# Patient Record
Sex: Male | Born: 1950 | Race: White | Hispanic: No | Marital: Married | Smoking: Never smoker
Health system: Southern US, Community
[De-identification: ages and names within clinical notes are randomized; demographics above are authoritative.]

## PROBLEM LIST (undated history)

## (undated) DIAGNOSIS — I728 Aneurysm of other specified arteries: Secondary | ICD-10-CM

## (undated) DIAGNOSIS — K219 Gastro-esophageal reflux disease without esophagitis: Secondary | ICD-10-CM

## (undated) HISTORY — DX: Aneurysm of other specified arteries: I72.8

---

## 2019-03-16 ENCOUNTER — Other Ambulatory Visit: Payer: Self-pay

## 2019-03-16 DIAGNOSIS — Z20822 Contact with and (suspected) exposure to covid-19: Secondary | ICD-10-CM

## 2019-03-18 LAB — NOVEL CORONAVIRUS, NAA: SARS-CoV-2, NAA: NOT DETECTED

## 2019-06-12 ENCOUNTER — Ambulatory Visit: Payer: Self-pay

## 2019-06-18 ENCOUNTER — Ambulatory Visit: Payer: Medicare HMO | Attending: Internal Medicine

## 2019-06-18 DIAGNOSIS — Z23 Encounter for immunization: Secondary | ICD-10-CM | POA: Insufficient documentation

## 2019-06-18 NOTE — Progress Notes (Signed)
   Covid-19 Vaccination Clinic  Name:  Robert Mullen    MRN: [id number] DOB: 02-10-1951  06/18/2019  [name] was observed post Covid-19 immunization for 15 minutes without incidence. He was provided with Vaccine Information Sheet and instruction to access the V-Safe system.   [name] was instructed to call 911 with any severe reactions post vaccine: Marland Kitchen Difficulty breathing  . Swelling of your face and throat  . A fast heartbeat  . A bad rash all over your body  . Dizziness and weakness    Immunizations Administered    Name Date Dose VIS Date Route   Pfizer COVID-19 Vaccine 06/18/2019 12:11 PM 0.3 mL 04/24/2019 Intramuscular   Manufacturer: [employer], [employer]   Lot: [id number]   NDC: [address]-5

## 2019-06-23 ENCOUNTER — Ambulatory Visit: Payer: Self-pay

## 2019-07-13 ENCOUNTER — Ambulatory Visit: Payer: Medicare HMO | Attending: Internal Medicine

## 2019-07-13 DIAGNOSIS — Z23 Encounter for immunization: Secondary | ICD-10-CM | POA: Insufficient documentation

## 2019-07-13 NOTE — Progress Notes (Signed)
   Covid-19 Vaccination Clinic  Name:  Robert Mullen    MRN: [id number] DOB: 05-01-1951  07/13/2019  [name] was observed post Covid-19 immunization for 15 minutes without incidence. He was provided with Vaccine Information Sheet and instruction to access the V-Safe system.   [name] was instructed to call 911 with any severe reactions post vaccine: Marland Kitchen Difficulty breathing  . Swelling of your face and throat  . A fast heartbeat  . A bad rash all over your body  . Dizziness and weakness    Immunizations Administered    Name Date Dose VIS Date Route   Pfizer COVID-19 Vaccine 07/13/2019  3:39 PM 0.3 mL 04/24/2019 Intramuscular   Manufacturer: [employer], [employer]   Lot: [id number]   NDC: [address]-3

## 2020-02-07 ENCOUNTER — Inpatient Hospital Stay (HOSPITAL_COMMUNITY)
Admission: EM | Admit: 2020-02-07 | Discharge: 2020-02-10 | DRG: 270 | Disposition: A | Discharge status: Home / Self Care | Payer: Medicare HMO | Attending: Internal Medicine | Admitting: Internal Medicine

## 2020-02-07 ENCOUNTER — Emergency Department (HOSPITAL_COMMUNITY): Payer: Medicare HMO

## 2020-02-07 ENCOUNTER — Other Ambulatory Visit: Payer: Self-pay

## 2020-02-07 ENCOUNTER — Encounter (HOSPITAL_COMMUNITY): Payer: Self-pay | Admitting: Emergency Medicine

## 2020-02-07 DIAGNOSIS — K219 Gastro-esophageal reflux disease without esophagitis: Secondary | ICD-10-CM | POA: Diagnosis present

## 2020-02-07 DIAGNOSIS — Z23 Encounter for immunization: Secondary | ICD-10-CM | POA: Diagnosis not present

## 2020-02-07 DIAGNOSIS — D62 Acute posthemorrhagic anemia: Secondary | ICD-10-CM | POA: Diagnosis not present

## 2020-02-07 DIAGNOSIS — N179 Acute kidney failure, unspecified: Secondary | ICD-10-CM | POA: Diagnosis present

## 2020-02-07 DIAGNOSIS — K661 Hemoperitoneum: Secondary | ICD-10-CM | POA: Diagnosis present

## 2020-02-07 DIAGNOSIS — I728 Aneurysm of other specified arteries: Secondary | ICD-10-CM | POA: Diagnosis present

## 2020-02-07 DIAGNOSIS — Z880 Allergy status to penicillin: Secondary | ICD-10-CM | POA: Diagnosis not present

## 2020-02-07 DIAGNOSIS — Z20822 Contact with and (suspected) exposure to covid-19: Secondary | ICD-10-CM | POA: Diagnosis present

## 2020-02-07 DIAGNOSIS — Z8711 Personal history of peptic ulcer disease: Secondary | ICD-10-CM | POA: Diagnosis not present

## 2020-02-07 HISTORY — DX: Gastro-esophageal reflux disease without esophagitis: K21.9

## 2020-02-07 LAB — URINALYSIS, ROUTINE W REFLEX MICROSCOPIC
Bilirubin Urine: NEGATIVE
Glucose, UA: NEGATIVE mg/dL
Ketones, ur: 5 mg/dL — AB
Leukocytes,Ua: NEGATIVE
Nitrite: NEGATIVE
Protein, ur: 30 mg/dL — AB
Specific Gravity, Urine: 1.02 (ref 1.005–1.030)
pH: 5 (ref 5.0–8.0)

## 2020-02-07 LAB — COMPREHENSIVE METABOLIC PANEL
ALT: 31 U/L (ref 0–44)
AST: 28 U/L (ref 15–41)
Albumin: 4.7 g/dL (ref 3.5–5.0)
Alkaline Phosphatase: 50 U/L (ref 38–126)
Anion gap: 14 (ref 5–15)
BUN: 33 mg/dL — ABNORMAL HIGH (ref 8–23)
CO2: 25 mmol/L (ref 22–32)
Calcium: 9.9 mg/dL (ref 8.9–10.3)
Chloride: 98 mmol/L (ref 98–111)
Creatinine, Ser: 3.1 mg/dL — ABNORMAL HIGH (ref 0.61–1.24)
GFR calc Af Amer: 23 mL/min — ABNORMAL LOW (ref >60)
GFR calc non Af Amer: 20 mL/min — ABNORMAL LOW (ref >60)
Glucose, Bld: 217 mg/dL — ABNORMAL HIGH (ref 70–99)
Potassium: 4.6 mmol/L (ref 3.5–5.1)
Sodium: 137 mmol/L (ref 135–145)
Total Bilirubin: 0.8 mg/dL (ref 0.3–1.2)
Total Protein: 7.7 g/dL (ref 6.5–8.1)

## 2020-02-07 LAB — CBC
HCT: 38.5 % — ABNORMAL LOW (ref 39.0–52.0)
HCT: 32.3 % — ABNORMAL LOW (ref 39.0–52.0)
Hemoglobin: 13 g/dL (ref 13.0–17.0)
Hemoglobin: 11 g/dL — ABNORMAL LOW (ref 13.0–17.0)
MCH: 34.1 pg — ABNORMAL HIGH (ref 26.0–34.0)
MCH: 34.4 pg — ABNORMAL HIGH (ref 26.0–34.0)
MCHC: 33.8 g/dL (ref 30.0–36.0)
MCHC: 34.1 g/dL (ref 30.0–36.0)
MCV: 101 fL — ABNORMAL HIGH (ref 80.0–100.0)
MCV: 100.9 fL — ABNORMAL HIGH (ref 80.0–100.0)
Platelets: 231 10*3/uL (ref 150–400)
Platelets: 193 10*3/uL (ref 150–400)
RBC: 3.81 MIL/uL — ABNORMAL LOW (ref 4.22–5.81)
RBC: 3.2 MIL/uL — ABNORMAL LOW (ref 4.22–5.81)
RDW: 12.1 % (ref 11.5–15.5)
RDW: 12.3 % (ref 11.5–15.5)
WBC: 17.7 10*3/uL — ABNORMAL HIGH (ref 4.0–10.5)
WBC: 14.9 10*3/uL — ABNORMAL HIGH (ref 4.0–10.5)
nRBC: 0 % (ref 0.0–0.2)
nRBC: 0 % (ref 0.0–0.2)

## 2020-02-07 LAB — CK: Total CK: 54 U/L (ref 49–397)

## 2020-02-07 LAB — PROTIME-INR
INR: 1 (ref 0.8–1.2)
Prothrombin Time: 12.3 seconds (ref 11.4–15.2)

## 2020-02-07 LAB — RESPIRATORY PANEL BY RT PCR (FLU A&B, COVID)
Influenza A by PCR: NEGATIVE
Influenza B by PCR: NEGATIVE
SARS Coronavirus 2 by RT PCR: NEGATIVE

## 2020-02-07 LAB — HEMOGLOBIN AND HEMATOCRIT, BLOOD
HCT: 32.3 % — ABNORMAL LOW (ref 39.0–52.0)
Hemoglobin: 10.5 g/dL — ABNORMAL LOW (ref 13.0–17.0)

## 2020-02-07 LAB — MRSA PCR SCREENING: MRSA by PCR: NEGATIVE

## 2020-02-07 LAB — PREPARE RBC (CROSSMATCH)

## 2020-02-07 LAB — LIPASE, BLOOD: Lipase: 22 U/L (ref 11–51)

## 2020-02-07 MED ORDER — HYDROMORPHONE HCL 1 MG/ML IJ SOLN
0.5000 mg | INTRAMUSCULAR | Status: DC | PRN
  Administered 2020-02-08 – 2020-02-09 (×3): 0.5 mg via INTRAVENOUS
  Administered 2020-02-09: 1 mg via INTRAVENOUS
  Administered 2020-02-10 (×3): 0.5 mg via INTRAVENOUS
  Filled 2020-02-07 (×7): qty 1

## 2020-02-07 MED ORDER — SODIUM CHLORIDE 0.9% IV SOLUTION: Freq: Once | INTRAVENOUS | Status: DC

## 2020-02-07 MED ORDER — ACETAMINOPHEN 650 MG RE SUPP: 650.0000 mg | Freq: Four times a day (QID) | RECTAL | Status: DC | PRN

## 2020-02-07 MED ORDER — SORBITOL 70 % SOLN
30.0000 mL | Freq: Every day | Status: DC | PRN
  Filled 2020-02-07: qty 30

## 2020-02-07 MED ORDER — SODIUM CHLORIDE 0.9 % IV SOLN: Freq: Once | INTRAVENOUS | Status: AC

## 2020-02-07 MED ORDER — ORAL CARE MOUTH RINSE
15.0000 mL | Freq: Two times a day (BID) | OROMUCOSAL | Status: DC
  Administered 2020-02-08 – 2020-02-10 (×2): 15 mL via OROMUCOSAL

## 2020-02-07 MED ORDER — DIPHENHYDRAMINE HCL 25 MG PO CAPS
25.0000 mg | ORAL_CAPSULE | Freq: Once | ORAL | Status: AC
  Administered 2020-02-07: 25 mg via ORAL
  Filled 2020-02-07: qty 1

## 2020-02-07 MED ORDER — CHLORHEXIDINE GLUCONATE CLOTH 2 % EX PADS
6.0000 | MEDICATED_PAD | Freq: Every day | CUTANEOUS | Status: DC
  Administered 2020-02-07 – 2020-02-10 (×4): 6 via TOPICAL

## 2020-02-07 MED ORDER — ALUM & MAG HYDROXIDE-SIMETH 200-200-20 MG/5ML PO SUSP
30.0000 mL | Freq: Once | ORAL | Status: AC
  Administered 2020-02-07: 30 mL via ORAL
  Filled 2020-02-07: qty 30

## 2020-02-07 MED ORDER — SODIUM CHLORIDE 0.9% FLUSH
3.0000 mL | Freq: Two times a day (BID) | INTRAVENOUS | Status: DC
  Administered 2020-02-07 – 2020-02-10 (×6): 3 mL via INTRAVENOUS

## 2020-02-07 MED ORDER — SENNOSIDES-DOCUSATE SODIUM 8.6-50 MG PO TABS: 1.0000 | ORAL_TABLET | Freq: Every evening | ORAL | Status: DC | PRN

## 2020-02-07 MED ORDER — ACETAMINOPHEN 325 MG PO TABS
650.0000 mg | ORAL_TABLET | Freq: Once | ORAL | Status: DC
  Filled 2020-02-07: qty 2

## 2020-02-07 MED ORDER — ALUM & MAG HYDROXIDE-SIMETH 200-200-20 MG/5ML PO SUSP
30.0000 mL | ORAL | Status: DC | PRN
  Administered 2020-02-07: 30 mL via ORAL
  Filled 2020-02-07: qty 30

## 2020-02-07 MED ORDER — LACTATED RINGERS IV SOLN: INTRAVENOUS | Status: DC

## 2020-02-07 MED ORDER — OXYCODONE HCL 5 MG PO TABS
5.0000 mg | ORAL_TABLET | ORAL | Status: DC | PRN
  Administered 2020-02-08 – 2020-02-10 (×2): 5 mg via ORAL
  Filled 2020-02-07 (×2): qty 1

## 2020-02-07 MED ORDER — ONDANSETRON HCL 4 MG/2ML IJ SOLN
4.0000 mg | Freq: Four times a day (QID) | INTRAMUSCULAR | Status: DC | PRN
  Administered 2020-02-08: 4 mg via INTRAVENOUS
  Filled 2020-02-07: qty 2

## 2020-02-07 MED ORDER — IOHEXOL 9 MG/ML PO SOLN
ORAL | Status: AC
  Filled 2020-02-07: qty 500

## 2020-02-07 MED ORDER — SODIUM CHLORIDE 0.9 % IV BOLUS
1000.0000 mL | Freq: Once | INTRAVENOUS | Status: AC
  Administered 2020-02-07: 1000 mL via INTRAVENOUS

## 2020-02-07 MED ORDER — ACETAMINOPHEN 325 MG PO TABS
650.0000 mg | ORAL_TABLET | Freq: Four times a day (QID) | ORAL | Status: DC | PRN
  Administered 2020-02-07 – 2020-02-10 (×4): 650 mg via ORAL
  Filled 2020-02-07 (×3): qty 2

## 2020-02-07 MED ORDER — IOHEXOL 9 MG/ML PO SOLN: 1000.0000 mL | Freq: Once | ORAL | Status: DC | PRN

## 2020-02-07 MED ORDER — FENTANYL CITRATE (PF) 100 MCG/2ML IJ SOLN
50.0000 ug | Freq: Once | INTRAMUSCULAR | Status: AC
  Administered 2020-02-07: 50 ug via INTRAVENOUS
  Filled 2020-02-07: qty 2

## 2020-02-07 MED ORDER — PRAVASTATIN SODIUM 20 MG PO TABS
40.0000 mg | ORAL_TABLET | Freq: Every day | ORAL | Status: DC
  Administered 2020-02-07 – 2020-02-10 (×4): 40 mg via ORAL
  Filled 2020-02-07 (×4): qty 2

## 2020-02-07 MED ORDER — ONDANSETRON HCL 4 MG PO TABS: 4.0000 mg | ORAL_TABLET | Freq: Four times a day (QID) | ORAL | Status: DC | PRN

## 2020-02-07 NOTE — Consult Note (Signed)
Chief Complaint:   Acute abd pain     Patient Status: [facility] - ED  History of Present Illness: Robert Mullen is a 69 y.o. male with acute abd pain earlier today. Came to ED with CT showing 2cm calcified splenic and hemoperitoneum.  Hgb 13 down to 11.  Hemodynamically stable. abd pain improving while in ED.  No other complaints.  No h/o of renal disease but Cr today 3.1  HD stable and resting in bed.  NAD.   Past Medical History:  Diagnosis Date  . GERD (gastroesophageal reflux disease)     History reviewed. No pertinent surgical history.  Allergies: Amoxicillin-pot clavulanate  Medications: Prior to Admission medications   Medication Sig Start Date End Date Taking? Authorizing Provider  pravastatin (PRAVACHOL) 40 MG tablet Take 1 tablet by mouth daily. 01/19/20  Yes [provider]     History reviewed. No pertinent family history.  Social History   Socioeconomic History  . Marital status: Married    Spouse name: Not on file  . Number of children: Not on file  . Years of education: Not on file  . Highest education level: Not on file  Occupational History  . Not on file  Tobacco Use  . Smoking status: Not on file  Substance and Sexual Activity  . Alcohol use: Not Currently  . Drug use: Not Currently  . Sexual activity: Not on file  Other Topics Concern  . Not on file  Social History Narrative  . Not on file   Social Determinants of Health   Financial Resource Strain:   . Difficulty of Paying Living Expenses: Not on file  Food Insecurity:   . Worried About Programme researcher, broadcasting/film/video in the Last Year: Not on file  . Ran Out of Food in the Last Year: Not on file  Transportation Needs:   . Lack of Transportation (Medical): Not on file  . Lack of Transportation (Non-Medical): Not on file  Physical Activity:   . Days of Exercise per Week: Not on file  . Minutes of Exercise per Session: Not on file  Stress:   . Feeling of Stress : Not on file  Social  Connections:   . Frequency of Communication with Friends and Family: Not on file  . Frequency of Social Gatherings with Friends and Family: Not on file  . Attends Religious Services: Not on file  . Active Member of Clubs or Organizations: Not on file  . Attends Banker Meetings: Not on file  . Marital Status: Not on file      Review of Systems: A 12 point ROS discussed and pertinent positives are indicated in the HPI above.  All other systems are negative.  Review of Systems  Vital Signs: BP 139/89   Pulse 95   Temp 97.7 F (36.5 C) (Oral)   Resp 16   SpO2 97%   Physical Exam Constitutional:      General: He is not in acute distress.    Appearance: He is not toxic-appearing.  Cardiovascular:     Rate and Rhythm: Normal rate and regular rhythm.     Heart sounds: Normal heart sounds. No murmur heard.   Pulmonary:     Effort: Pulmonary effort is normal.     Breath sounds: Normal breath sounds.  Abdominal:     General: Bowel sounds are normal. There is distension.     Tenderness: There is abdominal tenderness in the left upper quadrant.  Hernia: No hernia is present.  Neurological:     General: No focal deficit present.     Mental Status: He is alert.  Psychiatric:        Mood and Affect: Mood normal. Mood is not anxious.     Imaging: CT ABDOMEN PELVIS WO CONTRAST  Result Date: 02/07/2020 CLINICAL DATA:  GI bleed. Abdominal pain and cramping for 24 hours. Abnormal previous CT scan. EXAM: CT ABDOMEN AND PELVIS WITHOUT CONTRAST TECHNIQUE: Multidetector CT imaging of the abdomen and pelvis was performed following the standard protocol without IV contrast. COMPARISON:  Earlier CT scan, same date. FINDINGS: Lower chest: Streaky bibasilar atelectasis noted. No pleural effusions or pulmonary infiltrates. Aortic and coronary artery calcifications are noted. Hepatobiliary: No hepatic lesions are identified without contrast. Stable high attenuation perihepatic fluid.  The gallbladder is normal. No common bile duct dilatation. Pancreas: No mass, inflammation or ductal dilatation. Spleen: Normal size.  Stable high attenuation perihepatic fluid. Adrenals/Urinary Tract: The adrenal glands are normal and stable. Small renal calculi but no obstructing ureteral calculi or bladder calculi. No worrisome renal or bladder lesions. Small bladder diverticulum noted. Stomach/Bowel: The stomach, duodenum, small bowel and colon are unremarkable. No acute inflammatory changes, mass lesions or obstructive findings. No leaking oral contrast is identified. Significant sigmoid colon diverticulosis but no findings for acute diverticulitis. Vascular/Lymphatic: The aorta is normal in caliber. Mild tortuosity and scattered atherosclerotic calcifications. No periaortic fluid collections. No mesenteric or retroperitoneal mass or adenopathy. Reproductive: The prostate gland and seminal vesicles are unremarkable. Other: Persistent but stable high attenuation fluid throughout the abdomen pelvis worrisome for hemoperitoneum. 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 Could not exclude the possibility that this is leaking. Recommend vascular surgery consultation. Musculoskeletal: No significant bony findings. IMPRESSION: 1. Persistent but stable high attenuation fluid throughout the abdomen pelvis worrisome for hemoperitoneum. 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 Could not exclude the possibility that this is leaking. Recommend vascular surgery consultation. 2. Sigmoid colon diverticulosis without findings for acute diverticulitis. 3. Small renal calculi but no obstructing ureteral calculi or bladder calculi. Electronically Signed   By: Rudie Meyer M.D.   On: 02/07/2020 16:53   CT Renal Stone Study  Result Date: 02/07/2020 CLINICAL DATA:  Flank pain.  Abdominal pain. EXAM: CT ABDOMEN AND  PELVIS WITHOUT CONTRAST TECHNIQUE: Multidetector CT imaging of the abdomen and pelvis was performed following the standard protocol without IV contrast. COMPARISON:  None. FINDINGS: Lower chest: Hazy airspace opacities in bilateral lower lobes. Additional areas of dependent atelectasis. Hepatobiliary: No focal liver abnormality is seen. No gallstones, gallbladder wall thickening, or biliary dilatation. Pancreas: Unremarkable. No pancreatic ductal dilatation or surrounding inflammatory changes. Spleen: Normal in size without focal abnormality. Adrenals/Urinary Tract: Adrenal glands are unremarkable. Kidneys are normal, without renal calculi, focal lesion, or hydronephrosis. Small right peritrigonal bladder diverticulum. Stomach/Bowel: Stomach is within normal limits. The appendix is not seen. No evidence of bowel wall thickening, distention, or inflammatory changes. Diffuse left colonic diverticulosis without evidence of acute diverticulitis. Vascular/Lymphatic: Aortic atherosclerosis. No enlarged abdominal or pelvic lymph nodes. Reproductive: Mild enlargement of the prostate gland with coarse calcifications. Other: Moderate amount of high density fluid is seen within the abdomen and pelvis. There is a rim calcified 2.1 cm structure adjacent to the tail of the pancreas with surrounding fat stranding, which may represent calcified aneurysm or pseudoaneurysm. Musculoskeletal: No acute or  significant osseous findings. IMPRESSION: 1. Moderate amount of high density fluid within the abdomen and pelvis, concerning for hemoperitoneum. Alternatively this may represent a ruptured viscus, however the lack of free gas argues against that. A third consideration would be a radio occult acute pancreatitis. CT angiography, conventional angiography and/or surgical consult is recommended. 2. 2.1 cm rim calcified structure adjacent to the tail of the pancreas with surrounding fat stranding, which may represent calcified vascular  aneurysm or pseudoaneurysm. 3. Diffuse left colonic diverticulosis without evidence of acute diverticulitis. 4. Small right peritrigonal bladder diverticulum. 5. Mild enlargement of the prostate gland with coarse calcifications. Please correlate to serum PSA values. 6. Hazy airspace opacities in the dependent portions of the bilateral lower lobes may represent atelectasis or early airspace consolidation. 7. Aortic atherosclerosis. These results were called by telephone at the time of interpretation on 02/07/2020 at 2:07 pm to provider [facility] , who verbally acknowledged these results. Aortic Atherosclerosis (ICD10-I70.0). Electronically Signed   By: Ted Mcalpine M.D.   On: 02/07/2020 14:13    Labs:  CBC: Recent Labs    02/07/20 1152 02/07/20 1732  WBC 17.7* 14.9*  HGB 13.0 11.0*  HCT 38.5* 32.3*  PLT 231 193    COAGS: No results for input(s): INR, APTT in the last 8760 hours.  BMP: Recent Labs    02/07/20 1152  NA 137  K 4.6  CL 98  CO2 25  GLUCOSE 217*  BUN 33*  CALCIUM 9.9  CREATININE 3.10*  GFRNONAA 20*  GFRAA 23*    LIVER FUNCTION TESTS: Recent Labs    02/07/20 1152  BILITOT 0.8  AST 28  ALT 31  ALKPHOS 50  PROT 7.7  ALBUMIN 4.7    TUMOR MARKERS: No results for input(s): AFPTM, CEA, CA199, CHROMGRNA in the last 8760 hours.  Assessment and Plan:  2cm splenic aneurysm with hemoperitoneum.  HD stable and NAD.  Complicating factor is a Cr of 3.1   Plan for IV hydration, serial H/H, and repeat Cr in the morning.  intermediate care observation.  Hopefully this is related to volume status as he has never been told by his [facility] primary doc that he had renal insufficiency.    Consider angio/embo based on am labs.  If he were to show signs of more bleeding, the procedure may need to be done more urgently regardless of his Cr.  He understands this and all questions addressed.  D/w Dr Gerrit Friends and [place] by phone today.  Thank you for this interesting  consult.  I greatly enjoyed meeting Messiyah Waterson and look forward to participating in their care.  A copy of this report was sent to the requesting provider on this date.  Electronically Signed: Berdine Dance, MD 02/07/2020, 7:38 PM   I spent a total of 40 Minutes    in face to face in clinical consultation, greater than 50% of which was counseling/coordinating care for this patient.

## 2020-02-07 NOTE — ED Notes (Signed)
ED TO INPATIENT HANDOFF REPORT  Name/Age/Gender Robert Mullen 69 y.o. male  Code Status    Code Status Orders  (From admission, onward)         Start     Ordered   02/07/20 1904  Full code  Continuous        02/07/20 1913        Code Status History    This patient has a current code status but no historical code status.   Advance Care Planning Activity      Home/SNF/Other Home  Chief Complaint Hemoperitoneum [K66.1]  Level of Care/Admitting Diagnosis ED Disposition    ED Disposition Condition Comment   Admit  Hospital Area: [facility] [[id number]]  Level of Care: Stepdown [14]  Admit to SDU based on following criteria: Hemodynamic compromise or significant risk of instability:  Patient requiring short term acute titration and management of vasoactive drips, and invasive monitoring (i.e., CVP and Arterial line).  May admit patient to [facility] or [facility] if equivalent level of care is available:: No  Covid Evaluation: Confirmed COVID Negative  Diagnosis: Hemoperitoneum [431540]  Admitting Physician: Milinda Antis  Attending Physician: Gerri Lins, AVA [phone]  Estimated length of stay: 3 - 4 days  Certification:: I certify this patient will need inpatient services for at least 2 midnights       Medical History Past Medical History:  Diagnosis Date  . GERD (gastroesophageal reflux disease)     Allergies Allergies  Allergen Reactions  . Amoxicillin-Pot Clavulanate     Other reaction(s): Abdominal Pain    IV Location/Drains/Wounds Patient Lines/Drains/Airways Status    Active Line/Drains/Airways    Name Placement date Placement time Site Days   Peripheral IV 02/07/20 Right Antecubital 02/07/20  1151  Antecubital  less than 1          Labs/Imaging Results for orders placed or performed during the hospital encounter of 02/07/20 (from the past 48 hour(s))  Lipase, blood     Status: None   Collection Time: 02/07/20 11:52 AM   Result Value Ref Range   Lipase 22 11 - 51 U/L    Comment: Performed at [facility], [address] [place], [place] [address]  Comprehensive metabolic panel     Status: Abnormal   Collection Time: 02/07/20 11:52 AM  Result Value Ref Range   Sodium 137 135 - 145 mmol/L   Potassium 4.6 3.5 - 5.1 mmol/L   Chloride 98 98 - 111 mmol/L   CO2 25 22 - 32 mmol/L   Glucose, Bld 217 (H) 70 - 99 mg/dL    Comment: Glucose reference range applies only to samples taken after fasting for at least 8 hours.   BUN 33 (H) 8 - 23 mg/dL   Creatinine, Ser 9.32 (H) 0.61 - 1.24 mg/dL   Calcium 9.9 8.9 - 67.1 mg/dL   Total Protein 7.7 6.5 - 8.1 g/dL   Albumin 4.7 3.5 - 5.0 g/dL   AST 28 15 - 41 U/L   ALT 31 0 - 44 U/L   Alkaline Phosphatase 50 38 - 126 U/L   Total Bilirubin 0.8 0.3 - 1.2 mg/dL   GFR calc non Af Amer 20 (L) >60 mL/min   GFR calc Af Amer 23 (L) >60 mL/min   Anion gap 14 5 - 15    Comment: Performed at [facility], [address], [place], [place] [address]  CBC     Status: Abnormal  Collection Time: 02/07/20 11:52 AM  Result Value Ref Range   WBC 17.7 (H) 4.0 - 10.5 K/uL   RBC 3.81 (L) 4.22 - 5.81 MIL/uL   Hemoglobin 13.0 13.0 - 17.0 g/dL   HCT 16.1 (L) 39 - 52 %   MCV 101.0 (H) 80.0 - 100.0 fL   MCH 34.1 (H) 26.0 - 34.0 pg   MCHC 33.8 30.0 - 36.0 g/dL   RDW 09.6 04.5 - 40.9 %   Platelets 231 150 - 400 K/uL   nRBC 0.0 0.0 - 0.2 %    Comment: Performed at [facility], [address], [place], [place] [address]  CK     Status: None   Collection Time: 02/07/20  1:58 PM  Result Value Ref Range   Total CK 54 49.0 - 397.0 U/L    Comment: Performed at [facility], [address], [place], [place] [address]  Respiratory Panel by RT PCR (Flu A&B, Covid) - Nasopharyngeal Swab     Status: None   Collection Time: 02/07/20  2:09 PM   Specimen: Nasopharyngeal Swab  Result Value Ref Range   SARS Coronavirus 2  by RT PCR NEGATIVE NEGATIVE    Comment: (NOTE) SARS-CoV-2 target nucleic acids are NOT DETECTED.  The SARS-CoV-2 RNA is generally detectable in upper respiratoy specimens during the acute phase of infection. The lowest concentration of SARS-CoV-2 viral copies this assay can detect is 131 copies/mL. A negative result does not preclude SARS-Cov-2 infection and should not be used as the sole basis for treatment or other patient management decisions. A negative result may occur with  improper specimen collection/handling, submission of specimen other than nasopharyngeal swab, presence of viral mutation(s) within the areas targeted by this assay, and inadequate number of viral copies (<131 copies/mL). A negative result must be combined with clinical observations, patient history, and epidemiological information. The expected result is Negative.  Fact Sheet for Patients:  https://www.moore.com/  Fact Sheet for Healthcare Providers:  https://www.young.biz/  This test is no t yet approved or cleared by the [place] FDA and  has been authorized for detection and/or diagnosis of SARS-CoV-2 by FDA under an Emergency Use Authorization (EUA). This EUA will remain  in effect (meaning this test can be used) for the duration of the COVID-19 declaration under Section 564(b)(1) of the Act, 21 U.S.C. section 360bbb-3(b)(1), unless the authorization is terminated or revoked sooner.     Influenza A by PCR NEGATIVE NEGATIVE   Influenza B by PCR NEGATIVE NEGATIVE    Comment: (NOTE) The Xpert Xpress SARS-CoV-2/FLU/RSV assay is intended as an aid in  the diagnosis of influenza from Nasopharyngeal swab specimens and  should not be used as a sole basis for treatment. Nasal washings and  aspirates are unacceptable for Xpert Xpress SARS-CoV-2/FLU/RSV  testing.  Fact Sheet for Patients: https://www.moore.com/  Fact Sheet for Healthcare  Providers: https://www.young.biz/  This test is not yet approved or cleared by the [place] FDA and  has been authorized for detection and/or diagnosis of SARS-CoV-2 by  FDA under an Emergency Use Authorization (EUA). This EUA will remain  in effect (meaning this test can be used) for the duration of the  Covid-19 declaration under Section 564(b)(1) of the Act, 21  U.S.C. section 360bbb-3(b)(1), unless the authorization is  terminated or revoked. Performed at [facility], [address] [place], [place] [address]   CBC     Status: Abnormal   Collection Time: 02/07/20  5:32  PM  Result Value Ref Range   WBC 14.9 (H) 4.0 - 10.5 K/uL   RBC 3.20 (L) 4.22 - 5.81 MIL/uL   Hemoglobin 11.0 (L) 13.0 - 17.0 g/dL   HCT 16.132.3 (L) 39 - 52 %   MCV 100.9 (H) 80.0 - 100.0 fL   MCH 34.4 (H) 26.0 - 34.0 pg   MCHC 34.1 30.0 - 36.0 g/dL   RDW 09.612.3 04.511.5 - 40.915.5 %   Platelets 193 150 - 400 K/uL   nRBC 0.0 0.0 - 0.2 %    Comment: Performed at [facility], [address] [place], [place] [id number]   CT ABDOMEN PELVIS WO CONTRAST  Result Date: 02/07/2020 CLINICAL DATA:  GI bleed. Abdominal pain and cramping for 24 hours. Abnormal previous CT scan. EXAM: CT ABDOMEN AND PELVIS WITHOUT CONTRAST TECHNIQUE: Multidetector CT imaging of the abdomen and pelvis was performed following the standard protocol without IV contrast. COMPARISON:  Earlier CT scan, same date. FINDINGS: Lower chest: Streaky bibasilar atelectasis noted. No pleural effusions or pulmonary infiltrates. Aortic and coronary artery calcifications are noted. Hepatobiliary: No hepatic lesions are identified without contrast. Stable high attenuation perihepatic fluid. The gallbladder is normal. No common bile duct dilatation. Pancreas: No mass, inflammation or ductal dilatation. Spleen: Normal size.  Stable high attenuation perihepatic fluid. Adrenals/Urinary Tract: The adrenal glands are  normal and stable. Small renal calculi but no obstructing ureteral calculi or bladder calculi. No worrisome renal or bladder lesions. Small bladder diverticulum noted. Stomach/Bowel: The stomach, duodenum, small bowel and colon are unremarkable. No acute inflammatory changes, mass lesions or obstructive findings. No leaking oral contrast is identified. Significant sigmoid colon diverticulosis but no findings for acute diverticulitis. Vascular/Lymphatic: The aorta is normal in caliber. Mild tortuosity and scattered atherosclerotic calcifications. No periaortic fluid collections. No mesenteric or retroperitoneal mass or adenopathy. Reproductive: The prostate gland and seminal vesicles are unremarkable. Other: Persistent but stable high attenuation fluid throughout the abdomen pelvis worrisome for hemoperitoneum. 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 Could not exclude the possibility that this is leaking. Recommend vascular surgery consultation. Musculoskeletal: No significant bony findings. IMPRESSION: 1. Persistent but stable high attenuation fluid throughout the abdomen pelvis worrisome for hemoperitoneum. 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 Could not exclude the possibility that this is leaking. Recommend vascular surgery consultation. 2. Sigmoid colon diverticulosis without findings for acute diverticulitis. 3. Small renal calculi but no obstructing ureteral calculi or bladder calculi. Electronically Signed   By: Rudie MeyerP.  Gallerani M.D.   On: 02/07/2020 16:53   CT Renal Stone Study  Result Date: 02/07/2020 CLINICAL DATA:  Flank pain.  Abdominal pain. EXAM: CT ABDOMEN AND PELVIS WITHOUT CONTRAST TECHNIQUE: Multidetector CT imaging of the abdomen and pelvis was performed following the standard protocol without IV contrast. COMPARISON:  None. FINDINGS: Lower chest: Hazy airspace opacities in  bilateral lower lobes. Additional areas of dependent atelectasis. Hepatobiliary: No focal liver abnormality is seen. No gallstones, gallbladder wall thickening, or biliary dilatation. Pancreas: Unremarkable. No pancreatic ductal dilatation or surrounding inflammatory changes. Spleen: Normal in size without focal abnormality. Adrenals/Urinary Tract: Adrenal glands are unremarkable. Kidneys are normal, without renal calculi, focal lesion, or hydronephrosis. Small right peritrigonal bladder diverticulum. Stomach/Bowel: Stomach is within normal limits. The appendix is not seen. No evidence of bowel wall thickening, distention, or inflammatory changes. Diffuse left colonic diverticulosis without evidence of acute diverticulitis. Vascular/Lymphatic:  Aortic atherosclerosis. No enlarged abdominal or pelvic lymph nodes. Reproductive: Mild enlargement of the prostate gland with coarse calcifications. Other: Moderate amount of high density fluid is seen within the abdomen and pelvis. There is a rim calcified 2.1 cm structure adjacent to the tail of the pancreas with surrounding fat stranding, which may represent calcified aneurysm or pseudoaneurysm. Musculoskeletal: No acute or significant osseous findings. IMPRESSION: 1. Moderate amount of high density fluid within the abdomen and pelvis, concerning for hemoperitoneum. Alternatively this may represent a ruptured viscus, however the lack of free gas argues against that. A third consideration would be a radio occult acute pancreatitis. CT angiography, conventional angiography and/or surgical consult is recommended. 2. 2.1 cm rim calcified structure adjacent to the tail of the pancreas with surrounding fat stranding, which may represent calcified vascular aneurysm or pseudoaneurysm. 3. Diffuse left colonic diverticulosis without evidence of acute diverticulitis. 4. Small right peritrigonal bladder diverticulum. 5. Mild enlargement of the prostate gland with coarse  calcifications. Please correlate to serum PSA values. 6. Hazy airspace opacities in the dependent portions of the bilateral lower lobes may represent atelectasis or early airspace consolidation. 7. Aortic atherosclerosis. These results were called by telephone at the time of interpretation on 02/07/2020 at 2:07 pm to provider [facility] , who verbally acknowledged these results. Aortic Atherosclerosis (ICD10-I70.0). Electronically Signed   By: Ted Mcalpine M.D.   On: 02/07/2020 14:13    Pending Labs Unresulted Labs (From admission, onward)          Start     Ordered   02/08/20 0500  Comprehensive metabolic panel  Tomorrow morning,   R        02/07/20 1913   02/08/20 0500  CBC  Tomorrow morning,   R        02/07/20 1913   02/07/20 1941  Prepare RBC (crossmatch)  (Adult Blood Administration - Red Blood Cells)  Once,   R       Question Answer Comment  # of Units 4 units   Transfusion Indications Actively Bleeding / GI Bleed   Number of Units to Keep Ahead NO units ahead   If emergent release call blood bank Not emergent release      02/07/20 1941   02/07/20 1934  Hemoglobin and hematocrit, blood  Now then every 8 hours,   R (with STAT occurrences)      02/07/20 1934   02/07/20 1914  Protime-INR  Once,   STAT        02/07/20 1913   02/07/20 1905  Urinalysis, Complete w Microscopic  Once,   STAT        02/07/20 1913   02/07/20 1903  HIV Antibody (routine testing w rflx)  (HIV Antibody (Routine testing w reflex) panel)  Once,   STAT        02/07/20 1913   02/07/20 1825  Type and screen  ONCE - STAT,   STAT        02/07/20 1824   02/07/20 1153  Urine culture  ONCE - STAT,   STAT        02/07/20 1152   02/07/20 1057  Urinalysis, Routine w reflex microscopic Urine, Clean Catch  ONCE - STAT,   STAT        02/07/20 1057          Vitals/Pain Today's Vitals   02/07/20 1800 02/07/20 1830 02/07/20 1900 02/07/20 1930  BP: (!) 139/92 (!) 140/96 139/89 124/83  Pulse: 84 100 95 95  Resp: 16 18 16 18   Temp:      TempSrc:      SpO2: 97% 97% 97% 98%  PainSc:        Isolation Precautions No active isolations  Medications Medications  iohexol (OMNIPAQUE) 9 MG/ML oral solution 1,000 mL (has no administration in time range)  iohexol (OMNIPAQUE) 9 MG/ML oral solution (has no administration in time range)  sodium chloride flush (NS) 0.9 % injection 3 mL (has no administration in time range)  acetaminophen (TYLENOL) tablet 650 mg (650 mg Oral Given 02/07/20 1944)    Or  acetaminophen (TYLENOL) suppository 650 mg ( Rectal See Alternative 02/07/20 1944)  oxyCODONE (Oxy IR/ROXICODONE) immediate release tablet 5 mg (has no administration in time range)  HYDROmorphone (DILAUDID) injection 0.5-1 mg (has no administration in time range)  senna-docusate (Senokot-S) tablet 1 tablet (has no administration in time range)  sorbitol 70 % solution 30 mL (has no administration in time range)  ondansetron (ZOFRAN) tablet 4 mg (has no administration in time range)    Or  ondansetron (ZOFRAN) injection 4 mg (has no administration in time range)  pravastatin (PRAVACHOL) tablet 40 mg (has no administration in time range)  lactated ringers infusion (has no administration in time range)  0.9 %  sodium chloride infusion (Manually program via Guardrails IV Fluids) (has no administration in time range)  acetaminophen (TYLENOL) tablet 650 mg (0 mg Oral Hold 02/07/20 1948)  alum & mag hydroxide-simeth (MAALOX/MYLANTA) 200-200-20 MG/5ML suspension 30 mL (30 mLs Oral Given 02/07/20 1944)  0.9 %  sodium chloride infusion (Manually program via Guardrails IV Fluids) (has no administration in time range)  fentaNYL (SUBLIMAZE) injection 50 mcg (50 mcg Intravenous Given 02/07/20 1200)  sodium chloride 0.9 % bolus 1,000 mL (0 mLs Intravenous Stopped 02/07/20 1403)  alum & mag hydroxide-simeth (MAALOX/MYLANTA) 200-200-20 MG/5ML suspension 30 mL (30 mLs Oral Given 02/07/20 1403)  0.9 %  sodium chloride infusion  ( Intravenous New Bag/Given (Non-Interop) 02/07/20 1913)  diphenhydrAMINE (BENADRYL) capsule 25 mg (25 mg Oral Given 02/07/20 1944)    Mobility walks

## 2020-02-07 NOTE — ED Triage Notes (Signed)
Per pt, states abdominal pain and cramping for 24 hours-vomited once, normal BM this am

## 2020-02-07 NOTE — H&P (Signed)
Robert Mullen is an 69 y.o. male.   Chief Complaint: Abdominal pain, nausea, dizziness. HPI: The patient is a 69 yr old man. He states that yesterday, while he was in a hot shower he had sudden onset of abdominal pain. He then had dizziness such that he left the shower and went back to bed. He states that he had a very difficult time with pain and lightheadedness yesterday. His symptoms worsened today.  In the ED the patient was found to have Blood pressure of 108/81, normal temperature, tachycardia, and normal oxygen saturation.   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   Labwork demonstrated WBC of 17.7, Hemoglobin of 13.0, platelets of 231, Creatnine was 3.1. It appears that baseline creatinine is 1.0. Anion gap is 14. CBC was repeated 6 hour later after the patient had received IV fluids. WBC was now 14.9, Hemoglobin was now 11.0. Platelets were 193. INR is 1.0.  General surgery and interventional radiology were consulted by EDP. The patient is unable to undergo IR procedure to embolize the bleeding vessel due to his renal insufficiency. Recommendation of general surgery is to type and cross the patient for 4 units PRBC's, monitor H&H, IV fluid, and evaluate for reasons fo renal status.  The patient will be admitted to a step down bed. She will be given IV fluids, renal ultrasound is pending. Consider nephrology consult in the am if no improvement in creatinine.  The patient denies fevers, but he has had chills. Positive for nausea and vomiting and abdominal pain. No diarrhea or constipation. No neurological changes other than the feeling of dizziness.  [facility] have been consulted to admit the patient for further evaluation and treatment.   Past Medical History:  Diagnosis Date  . GERD (gastroesophageal reflux disease)     History  reviewed. No pertinent surgical history.  History reviewed. No pertinent family history. Social History:  reports previous alcohol use. He reports previous drug use. No history on file for tobacco use. (Not in a hospital admission)   Allergies:  Allergies  Allergen Reactions  . Amoxicillin-Pot Clavulanate     Other reaction(s): Abdominal Pain    Pertinent items noted in HPI and remainder of comprehensive ROS otherwise negative.   General appearance: alert, cooperative, fatigued and mild distress Head: Normocephalic, without obvious abnormality, atraumatic Eyes: conjunctivae/corneas clear. PERRL, EOM's intact. Fundi benign. Throat: lips, mucosa, and tongue normal; teeth and gums normal Neck: no adenopathy, no carotid bruit, no JVD, supple, symmetrical, trachea midline and thyroid not enlarged, symmetric, no tenderness/mass/nodules Resp: No increased work of breathing. No wheezes, rales, or rhonchi. No tactile fremitus. Chest wall: no tenderness Cardio: regular rate and rhythm, S1, S2 normal, no murmur, click, rub or gallop GI: Abdomen is distended, diffusely tender, especially in the left upper quadrant. There are hypoactive bowel sounds. No masses, organomegaly, or hernias are appreciated. Extremities: extremities normal, atraumatic, no cyanosis or edema Pulses: 2+ and symmetric Skin: Skin color, texture, turgor normal. No rashes or lesions Lymph nodes: Cervical, supraclavicular, and axillary nodes normal. Neurologic: Alert and oriented X 3, normal strength and tone. Normal symmetric reflexes. Normal coordination and gait  Results for orders placed or performed during the hospital encounter of 02/07/20 (from the past 48 hour(s))  Lipase, blood     Status: None   Collection Time: 02/07/20 11:52 AM  Result Value Ref Range  Lipase 22 11 - 51 U/L    Comment: Performed at [facility], [address] [place], [place] [address]  Comprehensive metabolic panel      Status: Abnormal   Collection Time: 02/07/20 11:52 AM  Result Value Ref Range   Sodium 137 135 - 145 mmol/L   Potassium 4.6 3.5 - 5.1 mmol/L   Chloride 98 98 - 111 mmol/L   CO2 25 22 - 32 mmol/L   Glucose, Bld 217 (H) 70 - 99 mg/dL    Comment: Glucose reference range applies only to samples taken after fasting for at least 8 hours.   BUN 33 (H) 8 - 23 mg/dL   Creatinine, Ser 6.96 (H) 0.61 - 1.24 mg/dL   Calcium 9.9 8.9 - 78.9 mg/dL   Total Protein 7.7 6.5 - 8.1 g/dL   Albumin 4.7 3.5 - 5.0 g/dL   AST 28 15 - 41 U/L   ALT 31 0 - 44 U/L   Alkaline Phosphatase 50 38 - 126 U/L   Total Bilirubin 0.8 0.3 - 1.2 mg/dL   GFR calc non Af Amer 20 (L) >60 mL/min   GFR calc Af Amer 23 (L) >60 mL/min   Anion gap 14 5 - 15    Comment: Performed at [facility], [address] [place], [place] [address]  CBC     Status: Abnormal   Collection Time: 02/07/20 11:52 AM  Result Value Ref Range   WBC 17.7 (H) 4.0 - 10.5 K/uL   RBC 3.81 (L) 4.22 - 5.81 MIL/uL   Hemoglobin 13.0 13.0 - 17.0 g/dL   HCT 75.1 (L) 39 - 52 %   MCV 101.0 (H) 80.0 - 100.0 fL   MCH 34.1 (H) 26.0 - 34.0 pg   MCHC 33.8 30.0 - 36.0 g/dL   RDW 02.5 85.2 - 77.8 %   Platelets 231 150 - 400 K/uL   nRBC 0.0 0.0 - 0.2 %    Comment: Performed at [facility], [address] [place], [place] [address]  CK     Status: None   Collection Time: 02/07/20  1:58 PM  Result Value Ref Range   Total CK 54 49.0 - 397.0 U/L    Comment: Performed at [facility], [address] [place], [place] [address]  Respiratory Panel by RT PCR (Flu A&B, Covid) - Nasopharyngeal Swab     Status: None   Collection Time: 02/07/20  2:09 PM   Specimen: Nasopharyngeal Swab  Result Value Ref Range   SARS Coronavirus 2 by RT PCR NEGATIVE NEGATIVE    Comment: (NOTE) SARS-CoV-2 target nucleic acids are NOT DETECTED.  The SARS-CoV-2 RNA is generally detectable in upper respiratoy specimens during the  acute phase of infection. The lowest concentration of SARS-CoV-2 viral copies this assay can detect is 131 copies/mL. A negative result does not preclude SARS-Cov-2 infection and should not be used as the sole basis for treatment or other patient management decisions. A negative result may occur with  improper specimen collection/handling, submission of specimen other than nasopharyngeal swab, presence of viral mutation(s) within the areas targeted by this assay, and inadequate number of viral copies (<131 copies/mL). A negative result must be combined with clinical observations, patient history, and epidemiological information. The expected result is Negative.  Fact Sheet for Patients:  https://www.moore.com/  Fact Sheet for Healthcare Providers:  https://www.young.biz/  This test is no t yet approved or cleared by the [place] and  has been authorized for detection and/or diagnosis of SARS-CoV-2 by FDA under an Emergency Use Authorization (EUA). This EUA will remain  in effect (meaning this test can be used) for the duration of the COVID-19 declaration under Section 564(b)(1) of the Act, 21 U.S.C. section 360bbb-3(b)(1), unless the authorization is terminated or revoked sooner.     Influenza A by PCR NEGATIVE NEGATIVE   Influenza B by PCR NEGATIVE NEGATIVE    Comment: (NOTE) The Xpert Xpress SARS-CoV-2/FLU/RSV assay is intended as an aid in  the diagnosis of influenza from Nasopharyngeal swab specimens and  should not be used as a sole basis for treatment. Nasal washings and  aspirates are unacceptable for Xpert Xpress SARS-CoV-2/FLU/RSV  testing.  Fact Sheet for Patients: https://www.moore.com/  Fact Sheet for Healthcare Providers: https://www.young.biz/  This test is not yet approved or cleared by the [place] FDA and  has been authorized for detection and/or diagnosis of  SARS-CoV-2 by  FDA under an Emergency Use Authorization (EUA). This EUA will remain  in effect (meaning this test can be used) for the duration of the  Covid-19 declaration under Section 564(b)(1) of the Act, 21  U.S.C. section 360bbb-3(b)(1), unless the authorization is  terminated or revoked. Performed at [facility], [address], [place], [place] [address]   CBC     Status: Abnormal   Collection Time: 02/07/20  5:32 PM  Result Value Ref Range   WBC 14.9 (H) 4.0 - 10.5 K/uL   RBC 3.20 (L) 4.22 - 5.81 MIL/uL   Hemoglobin 11.0 (L) 13.0 - 17.0 g/dL   HCT 57.8 (L) 39 - 52 %   MCV 100.9 (H) 80.0 - 100.0 fL   MCH 34.4 (H) 26.0 - 34.0 pg   MCHC 34.1 30.0 - 36.0 g/dL   RDW 46.9 62.9 - 52.8 %   Platelets 193 150 - 400 K/uL   nRBC 0.0 0.0 - 0.2 %    Comment: Performed at [facility], [address] [place], [place] [address]   [email or web address] @  Blood pressure 139/89, pulse 95, temperature 97.7 F (36.5 C), temperature source Oral, resp. rate 16, SpO2 97 %.   Assessment/Plan Problem  Nontraumatic Hemoperitoneum  Splenic Artery Aneurysm (Hcc)  Acute Kidney Injury (Nontraumatic) (Hcc)  Hemoperitoneum   Splenic artery aneurysm: 2cm splenic artery aneurysm visible on CT abdomen and pelvis with possible leakage. IR may be able to embolize the source of bleeding, but currently renal status will not tolerate IV contrast. Surgical intervention may also be used to address the source of bleeding, but holds considerably more risk and difficulty due to blood in the peritoneal cavity.  Non-traumatic hemoperitoneum: Will monitor hemoglobin and there are 4 units PRBC's Typed and crossed and held. Likely source of bleeding is the splenic artery aneurysm. PT/INR normal.  AKI: Creatinine upon presentation is 3.1. Baseline creatinine appears to be 1.0. DDx: Increased creatinine due to volume depletion/hypotension due to intraperitoneal hemorrhage, obstructive  uropathy, other primary renal cause. The patient will receive IV fluids overnight. Renal ultrasound has been ordered. Consider nephrology consult should the creatinine not be substantially improved in the morning.  GERD: Continue PPI.  I have seen and examined this patient myself. I have spent 82 minutes in her evaluation and care. More than 50% of this time was spent on coordination of care with Dr. of General Surgery.  DVT Prophylaxis; SCD's CODE STATUS: Full Code Family Communication: None available. Disposition: The patient has been admitted to a  step down bed as an inpatient. Status is: Inpatient  Remains inpatient appropriate because:Inpatient level of care appropriate due to severity of illness   Dispo: The patient is from: Home              Anticipated d/c is to: tbd              Anticipated d/c date is: > 3 days              Patient currently is not medically stable to d/c.  Severity of Illness: The appropriate patient status for this patient is INPATIENT. Inpatient status is judged to be reasonable and necessary in order to provide the required intensity of service to ensure the patient's safety. The patient's presenting symptoms, physical exam findings, and initial radiographic and laboratory data in the context of their chronic comorbidities is felt to place them at high risk for further clinical deterioration. Furthermore, it is not anticipated that the patient will be medically stable for discharge from the hospital within 2 midnights of admission. The following factors support the patient status of inpatient.   " The patient's presenting symptoms include Pain, abdominal distention. " The worrisome physical exam findings include abdominal distention and tenderness to palpation. " The initial radiographic and laboratory data are worrisome because of Hemoperitoneum due to splenic artery aneurysmal bleed.. " The chronic co-morbidities include None.   * I certify that at  the point of admission it is my clinical judgment that the patient will require inpatient hospital care spanning beyond 2 midnights from the point of admission due to high intensity of service, high risk for further deterioration and high frequency of surveillance required.*  Robert Mullen 02/07/2020, 7:42 PM

## 2020-02-07 NOTE — ED Notes (Signed)
General surgery at bedside. 

## 2020-02-07 NOTE — ED Provider Notes (Signed)
Care assumed from [facility] PA-C at shift change, please see her note for full details, but in brief Robert Mullen is a 69 y.o. male who presents with 24 hours of abdominal pain and one episode of emesis, abdomen is mildly distended.  Patient found to have AKI with creatinine of 3.1, was previously 1.  CT renal stone study shows a moderate amount of high density fluid in the abdomen pelvis concerning for hemoperitoneum versus ruptured viscus versus pancreatitis.  No trauma, lipase is normal.  Dr. Rowe Clack with surgery has been consulted and is coming to see the patient, but requests CT with p.o. contrast.  Plan: follow-up on CT, patient will need admission, waiting for surgical evaluation to determine if patient will go to surgery service or medicine team  BP (!) 140/96   Pulse 100   Temp 97.7 F (36.5 C) (Oral)   Resp 18   SpO2 97%    ED Course/Procedures   Clinical Course as of Feb 06 1606  Sun Feb 07, 2020  1244 Chart review shows creatinine of 1 in October 2020.   [HK]  1449 Spoke to Dr. Gerrit Friends, on-call surgeon.  Asked that we obtain a CT scan with p.o. contrast to evaluate if this is a perforated viscus.  Reexamined patient, he states that he may have been told in the past that he had an ulcer but has never underwent an endoscopy.   [HK]    Clinical Course User Index [HK] Dietrich Pates, PA-C   CT ABDOMEN PELVIS WO CONTRAST  Result Date: 02/07/2020 CLINICAL DATA:  GI bleed. Abdominal pain and cramping for 24 hours. Abnormal previous CT scan. EXAM: CT ABDOMEN AND PELVIS WITHOUT CONTRAST TECHNIQUE: Multidetector CT imaging of the abdomen and pelvis was performed following the standard protocol without IV contrast. COMPARISON:  Earlier CT scan, same date. FINDINGS: Lower chest: Streaky bibasilar atelectasis noted. No pleural effusions or pulmonary infiltrates. Aortic and coronary artery calcifications are noted. Hepatobiliary: No hepatic lesions are identified without contrast. Stable high  attenuation perihepatic fluid. The gallbladder is normal. No common bile duct dilatation. Pancreas: No mass, inflammation or ductal dilatation. Spleen: Normal size.  Stable high attenuation perihepatic fluid. Adrenals/Urinary Tract: The adrenal glands are normal and stable. Small renal calculi but no obstructing ureteral calculi or bladder calculi. No worrisome renal or bladder lesions. Small bladder diverticulum noted. Stomach/Bowel: The stomach, duodenum, small bowel and colon are unremarkable. No acute inflammatory changes, mass lesions or obstructive findings. No leaking oral contrast is identified. Significant sigmoid colon diverticulosis but no findings for acute diverticulitis. Vascular/Lymphatic: The aorta is normal in caliber. Mild tortuosity and scattered atherosclerotic calcifications. No periaortic fluid collections. No mesenteric or retroperitoneal mass or adenopathy. Reproductive: The prostate gland and seminal vesicles are unremarkable. Other: Persistent but stable high attenuation fluid throughout the abdomen pelvis worrisome for hemoperitoneum. 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 Could not exclude the possibility that this is leaking. Recommend vascular surgery consultation. Musculoskeletal: No significant bony findings. IMPRESSION: 1. Persistent but stable high attenuation fluid throughout the abdomen pelvis worrisome for hemoperitoneum. 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 Could not exclude the possibility that this is leaking. Recommend vascular surgery consultation. 2. Sigmoid colon diverticulosis without findings for acute diverticulitis. 3. Small renal calculi but no obstructing ureteral calculi or bladder calculi. Electronically Signed   By: Rudie Meyer M.D.   On: 02/07/2020  16:53   CT Renal Stone Study  Result Date: 02/07/2020 CLINICAL DATA:  Flank pain.   Abdominal pain. EXAM: CT ABDOMEN AND PELVIS WITHOUT CONTRAST TECHNIQUE: Multidetector CT imaging of the abdomen and pelvis was performed following the standard protocol without IV contrast. COMPARISON:  None. FINDINGS: Lower chest: Hazy airspace opacities in bilateral lower lobes. Additional areas of dependent atelectasis. Hepatobiliary: No focal liver abnormality is seen. No gallstones, gallbladder wall thickening, or biliary dilatation. Pancreas: Unremarkable. No pancreatic ductal dilatation or surrounding inflammatory changes. Spleen: Normal in size without focal abnormality. Adrenals/Urinary Tract: Adrenal glands are unremarkable. Kidneys are normal, without renal calculi, focal lesion, or hydronephrosis. Small right peritrigonal bladder diverticulum. Stomach/Bowel: Stomach is within normal limits. The appendix is not seen. No evidence of bowel wall thickening, distention, or inflammatory changes. Diffuse left colonic diverticulosis without evidence of acute diverticulitis. Vascular/Lymphatic: Aortic atherosclerosis. No enlarged abdominal or pelvic lymph nodes. Reproductive: Mild enlargement of the prostate gland with coarse calcifications. Other: Moderate amount of high density fluid is seen within the abdomen and pelvis. There is a rim calcified 2.1 cm structure adjacent to the tail of the pancreas with surrounding fat stranding, which may represent calcified aneurysm or pseudoaneurysm. Musculoskeletal: No acute or significant osseous findings. IMPRESSION: 1. Moderate amount of high density fluid within the abdomen and pelvis, concerning for hemoperitoneum. Alternatively this may represent a ruptured viscus, however the lack of free gas argues against that. A third consideration would be a radio occult acute pancreatitis. CT angiography, conventional angiography and/or surgical consult is recommended. 2. 2.1 cm rim calcified structure adjacent to the tail of the pancreas with surrounding fat stranding, which  may represent calcified vascular aneurysm or pseudoaneurysm. 3. Diffuse left colonic diverticulosis without evidence of acute diverticulitis. 4. Small right peritrigonal bladder diverticulum. 5. Mild enlargement of the prostate gland with coarse calcifications. Please correlate to serum PSA values. 6. Hazy airspace opacities in the dependent portions of the bilateral lower lobes may represent atelectasis or early airspace consolidation. 7. Aortic atherosclerosis. These results were called by telephone at the time of interpretation on 02/07/2020 at 2:07 pm to provider [facility] , who verbally acknowledged these results. Aortic Atherosclerosis (ICD10-I70.0). Electronically Signed   By: Ted Mcalpine M.D.   On: 02/07/2020 14:13   .Critical Care Performed by: Dartha Lodge, PA-C Authorized by: Dartha Lodge, PA-C   Critical care provider statement:    Critical care time (minutes):  45   Critical care was necessary to treat or prevent imminent or life-threatening deterioration of the following conditions:  Circulatory failure (Leaking splenic artery aneurysm)   Critical care was time spent personally by me on the following activities:  Discussions with consultants, evaluation of patient's response to treatment, examination of patient, ordering and performing treatments and interventions, ordering and review of laboratory studies, ordering and review of radiographic studies, pulse oximetry, re-evaluation of patient's condition, obtaining history from patient or surrogate and review of old charts Comments:     Patient found to have hemoperitoneum due to leaking splenic artery aneurysm    MDM   CT with oral contrast shows persistent but stable high attenuation fluid throughout the abdomen and pelvis worrisome for hemoperitoneum, the epicenter appears to be in the left upper quadrant surrounding a 2 cm splenic artery aneurysm, there is a possibility that this is leaking into the abdomen.  Will  discuss CT findings again with Dr. Gerrit Friends to determine if intervention with vascular surgery or interventional radiology will be necessary.  Dr.  Gerkin recommends consulting interventional radiology as patient will likely need embolization, he will still plan to see the patient shortly in the ED, and recommends medicine admission.  Will also get repeat CBC to check delta hemoglobin.  Case discussed with Dr. Miles Costain with interventional radiology who will review patient scans shortly and give me a call back.  Medicine called for admission.  Repeat hemoglobin has dropped 2 points and is now 11.  Will check type and screen, currently patient remains hemodynamically stable.  Case discussed with Dr. Gerri Lins with [facility] who will see and admit the patient.  Final diagnoses:  AKI (acute kidney injury) (HCC)  Splenic artery aneurysm [facility])  Hemoperitoneum       Legrand Rams 02/07/20 1909    Tegeler, Canary Brim, MD 02/08/20 [phone]

## 2020-02-07 NOTE — ED Provider Notes (Signed)
[facility]-EMERGENCY DEPT Provider Note   CSN: [id number] Arrival date & time: 02/07/20  1030     History Chief Complaint  Patient presents with  . Abdominal Pain    Robert Mullen is a 69 y.o. male with a past medical history of GERD presenting to the ED with a chief complaint of abdominal pain.  Started having pain approximately 24 hours ago.  Reports 1 episode of nonbloody, nonbilious emesis.  He has been taking his daily antacid without much improvement in his symptoms.  He denies any changes to bowel movements, had a normal bowel movement earlier today.  Denies any dysuria or hematuria.  Reports the pain is causing him to have some difficulty breathing due to the muscle soreness.  No sick contacts with similar symptoms.  No suspicious food intake.  Denies any back pain, fever, chest pain, injuries or falls, history of kidney stones.  No prior abdominal surgeries. He switched from famotidine to omeprazole 4 days ago for his reflux.  HPI     Past Medical History:  Diagnosis Date  . GERD (gastroesophageal reflux disease)     There are no problems to display for this patient.   History reviewed. No pertinent surgical history.     No family history on file.  Social History   Tobacco Use  . Smoking status: Not on file  Substance Use Topics  . Alcohol use: Not Currently  . Drug use: Not Currently    Home Medications Prior to Admission medications   Medication Sig Start Date End Date Taking? Authorizing Provider  pravastatin (PRAVACHOL) 40 MG tablet Take 1 tablet by mouth daily. 01/19/20  Yes [provider]    Allergies    Amoxicillin-pot clavulanate  Review of Systems   Review of Systems  Constitutional: Negative for appetite change, chills and fever.  HENT: Negative for ear pain, rhinorrhea, sneezing and sore throat.   Eyes: Negative for photophobia and visual disturbance.  Respiratory: Negative for cough, chest tightness, shortness  of breath and wheezing.   Cardiovascular: Negative for chest pain and palpitations.  Gastrointestinal: Positive for abdominal pain, nausea and vomiting. Negative for blood in stool, constipation and diarrhea.  Genitourinary: Negative for dysuria, hematuria and urgency.  Musculoskeletal: Negative for myalgias.  Skin: Negative for rash.  Neurological: Negative for dizziness, weakness and light-headedness.    Physical Exam Updated Vital Signs BP (!) 134/93   Pulse 95   Temp 97.7 F (36.5 C) (Oral)   Resp 18   SpO2 97%   Physical Exam Vitals and nursing note reviewed.  Constitutional:      General: He is not in acute distress.    Appearance: He is well-developed.  HENT:     Head: Normocephalic and atraumatic.     Nose: Nose normal.  Eyes:     General: No scleral icterus.       Left eye: No discharge.     Conjunctiva/sclera: Conjunctivae normal.  Cardiovascular:     Rate and Rhythm: Normal rate and regular rhythm.     Heart sounds: Normal heart sounds. No murmur heard.  No friction rub. No gallop.   Pulmonary:     Effort: Pulmonary effort is normal. No respiratory distress.     Breath sounds: Normal breath sounds.  Abdominal:     General: Bowel sounds are normal. There is no distension.     Palpations: Abdomen is soft.     Tenderness: There is abdominal tenderness in the right upper quadrant and  right lower quadrant. There is no guarding.  Musculoskeletal:        General: Normal range of motion.     Cervical back: Normal range of motion and neck supple.  Skin:    General: Skin is warm and dry.     Findings: No rash.  Neurological:     Mental Status: He is alert.     Motor: No abnormal muscle tone.     Coordination: Coordination normal.     ED Results / Procedures / Treatments   Labs (all labs ordered are listed, but only abnormal results are displayed) Labs Reviewed  COMPREHENSIVE METABOLIC PANEL - Abnormal; Notable for the following components:      Result Value    Glucose, Bld 217 (*)    BUN 33 (*)    Creatinine, Ser 3.10 (*)    GFR calc non Af Amer 20 (*)    GFR calc Af Amer 23 (*)    All other components within normal limits  CBC - Abnormal; Notable for the following components:   WBC 17.7 (*)    RBC 3.81 (*)    HCT 38.5 (*)    MCV 101.0 (*)    MCH 34.1 (*)    All other components within normal limits  URINE CULTURE  RESPIRATORY PANEL BY RT PCR (FLU A&B, COVID)  LIPASE, BLOOD  CK  URINALYSIS, ROUTINE W REFLEX MICROSCOPIC    EKG EKG Interpretation  Date/Time:  Sunday February 07 2020 13:56:46 EDT Ventricular Rate:  90 PR Interval:    QRS Duration: 101 QT Interval:  350 QTC Calculation: 429 R Axis:   62 Text Interpretation: Sinus rhythm No prior ECG for comparison. No STEMI Confirmed by Theda Belfast ([id number]) on 02/07/2020 2:26:14 PM   Radiology CT Renal Stone Study  Result Date: 02/07/2020 CLINICAL DATA:  Flank pain.  Abdominal pain. EXAM: CT ABDOMEN AND PELVIS WITHOUT CONTRAST TECHNIQUE: Multidetector CT imaging of the abdomen and pelvis was performed following the standard protocol without IV contrast. COMPARISON:  None. FINDINGS: Lower chest: Hazy airspace opacities in bilateral lower lobes. Additional areas of dependent atelectasis. Hepatobiliary: No focal liver abnormality is seen. No gallstones, gallbladder wall thickening, or biliary dilatation. Pancreas: Unremarkable. No pancreatic ductal dilatation or surrounding inflammatory changes. Spleen: Normal in size without focal abnormality. Adrenals/Urinary Tract: Adrenal glands are unremarkable. Kidneys are normal, without renal calculi, focal lesion, or hydronephrosis. Small right peritrigonal bladder diverticulum. Stomach/Bowel: Stomach is within normal limits. The appendix is not seen. No evidence of bowel wall thickening, distention, or inflammatory changes. Diffuse left colonic diverticulosis without evidence of acute diverticulitis. Vascular/Lymphatic: Aortic atherosclerosis.  No enlarged abdominal or pelvic lymph nodes. Reproductive: Mild enlargement of the prostate gland with coarse calcifications. Other: Moderate amount of high density fluid is seen within the abdomen and pelvis. There is a rim calcified 2.1 cm structure adjacent to the tail of the pancreas with surrounding fat stranding, which may represent calcified aneurysm or pseudoaneurysm. Musculoskeletal: No acute or significant osseous findings. IMPRESSION: 1. Moderate amount of high density fluid within the abdomen and pelvis, concerning for hemoperitoneum. Alternatively this may represent a ruptured viscus, however the lack of free gas argues against that. A third consideration would be a radio occult acute pancreatitis. CT angiography, conventional angiography and/or surgical consult is recommended. 2. 2.1 cm rim calcified structure adjacent to the tail of the pancreas with surrounding fat stranding, which may represent calcified vascular aneurysm or pseudoaneurysm. 3. Diffuse left colonic diverticulosis without evidence of acute diverticulitis. 4.  Small right peritrigonal bladder diverticulum. 5. Mild enlargement of the prostate gland with coarse calcifications. Please correlate to serum PSA values. 6. Hazy airspace opacities in the dependent portions of the bilateral lower lobes may represent atelectasis or early airspace consolidation. 7. Aortic atherosclerosis. These results were called by telephone at the time of interpretation on 02/07/2020 at 2:07 pm to [facility] , who verbally acknowledged these results. Aortic Atherosclerosis (ICD10-I70.0). Electronically Signed   By: Ted Mcalpineobrinka  Dimitrova M.D.   On: 02/07/2020 14:13    Procedures .Critical Care Performed by: Dietrich PatesKhatri, Khaleah Duer, PA-C Authorized by: Dietrich PatesKhatri, Stephen Turnbaugh, PA-C   Critical care provider statement:    Critical care time (minutes):  45   Critical care was necessary to treat or prevent imminent or life-threatening deterioration of the following  conditions:  Renal failure, metabolic crisis and shock   Critical care was time spent personally by me on the following activities:  Development of treatment plan with patient or surrogate, discussions with consultants, examination of patient, obtaining history from patient or surrogate, ordering and performing treatments and interventions, ordering and review of laboratory studies, ordering and review of radiographic studies, re-evaluation of patient's condition, review of old charts and evaluation of patient's response to treatment   I assumed direction of critical care for this patient from another provider in my specialty: no     (including critical care time)  Medications Ordered in ED Medications  iohexol (OMNIPAQUE) 9 MG/ML oral solution 1,000 mL (has no administration in time range)  iohexol (OMNIPAQUE) 9 MG/ML oral solution (has no administration in time range)  fentaNYL (SUBLIMAZE) injection 50 mcg (50 mcg Intravenous Given 02/07/20 1200)  sodium chloride 0.9 % bolus 1,000 mL (0 mLs Intravenous Stopped 02/07/20 1403)  alum & mag hydroxide-simeth (MAALOX/MYLANTA) 200-200-20 MG/5ML suspension 30 mL (30 mLs Oral Given 02/07/20 1403)    ED Course  I have reviewed the triage vital signs and the nursing notes.  Pertinent labs & imaging results that were available during my care of the patient were reviewed by me and considered in my medical decision making (see chart for details).  Clinical Course as of Feb 07 1503  Sun Feb 07, 2020  1244 Chart review shows creatinine of 1 in October 2020.   [HK]  1449 Spoke to Dr. Gerrit FriendsGerkin, on-call surgeon.  Asked that we obtain a CT scan with p.o. contrast to evaluate if this is a perforated viscus.  Reexamined patient, he states that he may have been told in the past that he had an ulcer but has never underwent an endoscopy.   [HK]    Clinical Course User Index [HK] Dietrich PatesKhatri, Joselyne Spake, PA-C   MDM Rules/Calculators/A&P                          69 year old  male with a past medical history of GERD presenting to the ED with a chief complaint of abdominal pain.  Pain began approximately 24 hours ago with 1 episode of nonbloody, nonbilious emesis.  Pain is located in the right upper and right middle quadrant and will intermittently radiate to his right lower quadrant.  No urinary symptoms.  Had a normal bowel movement today.  Reports muscle soreness in his abdomen.  Patient denies any recent strenuous activity other than what he usually does as far as playing tennis.  Work appear significant for leukocytosis of 17, creatinine of 3, his creatinine of 1 year ago was 1.  No history of kidney  disease that he is aware of.  Lipase unremarkable.  Will need to obtain UA, CK, CT renal stone study and reassess.  CK is normal.  EKG shows sinus rhythm.  CT renal stone study shows moderate amount of high density fluid within the abdomen pelvis concerning for hemoperitoneum versus ruptured viscus versus pancreatitis.  Patient denies any trauma.  Lipase is normal here.  Will consult surgery to review imaging and give recommendations.    Will obtain CT of abdomen pelvis with p.o. contrast per surgery recommendations, to evaluate if this is a ruptured viscus.  Patient updated on plan.  Told patient and visitor that he will be admitted to the hospital for his AKI at the very least.  He is agreeable to this plan. He remains HDS. Care handed off to oncoming provider pending remainder of workup and recheck.  All imaging, if done today, including plain films, CT scans, and ultrasounds, independently reviewed by me, and interpretations confirmed via formal radiology reads.  Portions of this note were generated with Scientist, clinical (histocompatibility and immunogenetics). Dictation errors may occur despite best attempts at proofreading.  Final Clinical Impression(s) / ED Diagnoses Final diagnoses:  AKI (acute kidney injury) [facility])    Rx / DC Orders ED Discharge Orders    None       Dietrich Pates,  PA-C 02/07/20 1507    Tegeler, Canary Brim, MD 02/08/20 [phone]

## 2020-02-07 NOTE — Plan of Care (Signed)
The patient is admitted from ED to ICU SD. A & O x 4. No acute respiratory distress noted. C/O abdominal pain of 4/10 but refused to take pain medication. The patient is oriented to his room, call bell and staff. Full assessment to epic completed.  No active bleeding/hematoma noted.Will continue to monitor.

## 2020-02-07 NOTE — Progress Notes (Signed)
General Surgery [facility]  Telephone conversation with Dr. Ruel Favors from interventional radiology.  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  However, he is concerned about the acute kidney injury with a creatinine of 3.1.  There is no obvious cause for the kidney injury.  He is concerned about administering intravenous contrast material at this time.  Alternatively we could proceed with laparotomy and splenectomy for management.  However, this would be a relatively extensive procedure and may be difficult due to the amount of blood in the abdomen which would distort the tissue planes and due to the location of the aneurysm adjacent to the tail of the pancreas.  Patient does appear to be hemodynamically stable.  He has been symptomatic for 30 hours.  Repeat hemoglobin shows a decrease in hemoglobin from 13 to 11 over the past 6 hours with intravenous hydration.  I would recommend admitting the patient to the stepdown unit for hemodynamic monitoring.  Patient should have sequential hemoglobin determinations.  Patient should continue to be resuscitated with intravenous fluids and any other measures recommended by the medical service once they have evaluated the patient. Patient should be typed and crossmatched for 4 units of PRBC's to be held in the blood bank in case needed. If the patient remains stable overnight, we will repeat his laboratory studies tomorrow morning and if there is adequate improvement in his renal function, proceed with embolization of the splenic artery by interventional radiology at that time.  Certainly if the patient would clinically deteriorate in any way, we would need to proceed to the operating room urgently for splenectomy.  I will discuss this plan with the medical service once they have evaluated the patient.  I did call the emergency department  and communicated this information to the treatment team at that location.  Darnell Level, MD [facility] Office: [phone]

## 2020-02-07 NOTE — Consult Note (Signed)
General Surgery [facility]  Reason for Consult: abdominal pain, hemoperitoneum  Referring Physician: Dr. Rush Landmark, [facility]  Robert Mullen is an 69 y.o. male.  HPI: Patient is a 69 year old male who presents with a 1 day history of abdominal pain and distention associated with syncope.  Patient developed acute onset of sharp mid abdominal pain while taking a shower approximately midday on February 06, 2020.  Patient felt faint and had to get out of the shower and lie down.  Patient has had persistent abdominal discomfort and abdominal distention.  He presented to the emergency department for evaluation.  CT scan of the abdomen and pelvis demonstrated high density fluid in the upper abdomen, around the liver, and extending into the lower abdomen.  Initial CT scan was done without oral contrast.  Scan was repeated with oral contrast to rule out perforated viscus.  There is no sign of perforated viscus.  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  There is high suspicion of the splenic artery aneurysm being source of hemoperitoneum.  Laboratory studies are also notable for a elevated creatinine of 3.1.  Patient has no significant past medical or surgical history.  He takes a statin.  He takes a proton pump inhibitor.  He has had a history of peptic ulcer disease.  He has had no prior abdominal surgery.  Patient is accompanied by his wife.  He works in [employer], dispensing.  Past Medical History:  Diagnosis Date  . GERD (gastroesophageal reflux disease)     History reviewed. No pertinent surgical history.  History reviewed. No pertinent family history.  Social History:  reports previous alcohol use. He reports previous drug use. No history on file for tobacco use.  Allergies:  Allergies  Allergen Reactions  . Amoxicillin-Pot Clavulanate     Other reaction(s): Abdominal Pain    Medications:  I have reviewed the patient's current medications.  Results for orders placed or performed during the hospital encounter of 02/07/20 (from the past 48 hour(s))  Lipase, blood     Status: None   Collection Time: 02/07/20 11:52 AM  Result Value Ref Range   Lipase 22 11 - 51 U/L    Comment: Performed at [facility], [address] [place], [place] [address]  Comprehensive metabolic panel     Status: Abnormal   Collection Time: 02/07/20 11:52 AM  Result Value Ref Range   Sodium 137 135 - 145 mmol/L   Potassium 4.6 3.5 - 5.1 mmol/L   Chloride 98 98 - 111 mmol/L   CO2 25 22 - 32 mmol/L   Glucose, Bld 217 (H) 70 - 99 mg/dL    Comment: Glucose reference range applies only to samples taken after fasting for at least 8 hours.   BUN 33 (H) 8 - 23 mg/dL   Creatinine, Ser 4.25 (H) 0.61 - 1.24 mg/dL   Calcium 9.9 8.9 - 95.6 mg/dL   Total Protein 7.7 6.5 - 8.1 g/dL   Albumin 4.7 3.5 - 5.0 g/dL   AST 28 15 - 41 U/L   ALT 31 0 - 44 U/L   Alkaline Phosphatase 50 38 - 126 U/L   Total Bilirubin 0.8 0.3 - 1.2 mg/dL   GFR calc non Af Amer 20 (L) >60 mL/min   GFR calc Af Amer 23 (L) >60 mL/min   Anion gap 14 5 - 15    Comment:  Performed at [facility], [address] [place], [place] [address]  CBC     Status: Abnormal   Collection Time: 02/07/20 11:52 AM  Result Value Ref Range   WBC 17.7 (H) 4.0 - 10.5 K/uL   RBC 3.81 (L) 4.22 - 5.81 MIL/uL   Hemoglobin 13.0 13.0 - 17.0 g/dL   HCT 81.138.5 (L) 39 - 52 %   MCV 101.0 (H) 80.0 - 100.0 fL   MCH 34.1 (H) 26.0 - 34.0 pg   MCHC 33.8 30.0 - 36.0 g/dL   RDW 91.412.1 78.211.5 - 95.615.5 %   Platelets 231 150 - 400 K/uL   nRBC 0.0 0.0 - 0.2 %    Comment: Performed at [facility], [address] [place], [place] [address]  [place]     Status: None   Collection Time: 02/07/20  1:58 PM  Result Value Ref Range   Total CK 54 49.0 - 397.0 U/L    Comment: Performed at [facility], [address] [place], [place] [address]  Respiratory Panel by RT PCR (Flu A&B, Covid) - Nasopharyngeal Swab     Status: None   Collection Time: 02/07/20  2:09 PM   Specimen: Nasopharyngeal Swab  Result Value Ref Range   SARS Coronavirus 2 by RT PCR NEGATIVE NEGATIVE    Comment: (NOTE) SARS-CoV-2 target nucleic acids are NOT DETECTED.  The SARS-CoV-2 RNA is generally detectable in upper respiratoy specimens during the acute phase of infection. The lowest concentration of SARS-CoV-2 viral copies this assay can detect is 131 copies/mL. A negative result does not preclude SARS-Cov-2 infection and should not be used as the sole basis for treatment or other patient management decisions. A negative result may occur with  improper specimen collection/handling, submission of specimen other than nasopharyngeal swab, presence of viral mutation(s) within the areas targeted by this assay, and inadequate number of viral copies (<131 copies/mL). A negative result must be combined with clinical observations, patient history, and epidemiological information. The expected result is Negative.  Fact Sheet for Patients:  https://www.moore.com/https://www.fda.gov/media/142436/download  Fact Sheet for Healthcare Providers:  https://www.young.biz/https://www.fda.gov/media/142435/download  This test is no t yet approved or cleared by the [place] FDA and  has been authorized for detection and/or diagnosis of SARS-CoV-2 by FDA under an Emergency Use Authorization (EUA). This EUA will remain  in effect (meaning this test can be used) for the duration of the COVID-19 declaration under Section 564(b)(1) of the Act, 21 U.S.C. section 360bbb-3(b)(1), unless the authorization is terminated or revoked sooner.     Influenza A by PCR NEGATIVE NEGATIVE   Influenza B by PCR NEGATIVE NEGATIVE    Comment: (NOTE) The Xpert Xpress SARS-CoV-2/FLU/RSV assay is intended as an aid in  the diagnosis of influenza from Nasopharyngeal swab specimens and  should not be used  as a sole basis for treatment. Nasal washings and  aspirates are unacceptable for Xpert Xpress SARS-CoV-2/FLU/RSV  testing.  Fact Sheet for Patients: https://www.moore.com/https://www.fda.gov/media/142436/download  Fact Sheet for Healthcare Providers: https://www.young.biz/https://www.fda.gov/media/142435/download  This test is not yet approved or cleared by the [place] FDA and  has been authorized for detection and/or diagnosis of SARS-CoV-2 by  FDA under an Emergency Use Authorization (EUA). This EUA will remain  in effect (meaning this test can be used) for the duration of the  Covid-19 declaration under Section 564(b)(1) of the Act, 21  U.S.C. section 360bbb-3(b)(1), unless the authorization is  terminated or revoked. Performed at [facility],  [address] [place], [place] [address]   CBC     Status: Abnormal   Collection Time: 02/07/20  5:32 PM  Result Value Ref Range   WBC 14.9 (H) 4.0 - 10.5 K/uL   RBC 3.20 (L) 4.22 - 5.81 MIL/uL   Hemoglobin 11.0 (L) 13.0 - 17.0 g/dL   HCT 60.4 (L) 39 - 52 %   MCV 100.9 (H) 80.0 - 100.0 fL   MCH 34.4 (H) 26.0 - 34.0 pg   MCHC 34.1 30.0 - 36.0 g/dL   RDW 54.0 98.1 - 19.1 %   Platelets 193 150 - 400 K/uL   nRBC 0.0 0.0 - 0.2 %    Comment: Performed at [facility], [address], [place], [place] [address]    CT ABDOMEN PELVIS WO CONTRAST  Result Date: 02/07/2020 CLINICAL DATA:  GI bleed. Abdominal pain and cramping for 24 hours. Abnormal previous CT scan. EXAM: CT ABDOMEN AND PELVIS WITHOUT CONTRAST TECHNIQUE: Multidetector CT imaging of the abdomen and pelvis was performed following the standard protocol without IV contrast. COMPARISON:  Earlier CT scan, same date. FINDINGS: Lower chest: Streaky bibasilar atelectasis noted. No pleural effusions or pulmonary infiltrates. Aortic and coronary artery calcifications are noted. Hepatobiliary: No hepatic lesions are identified without contrast. Stable high attenuation perihepatic fluid.  The gallbladder is normal. No common bile duct dilatation. Pancreas: No mass, inflammation or ductal dilatation. Spleen: Normal size.  Stable high attenuation perihepatic fluid. Adrenals/Urinary Tract: The adrenal glands are normal and stable. Small renal calculi but no obstructing ureteral calculi or bladder calculi. No worrisome renal or bladder lesions. Small bladder diverticulum noted. Stomach/Bowel: The stomach, duodenum, small bowel and colon are unremarkable. No acute inflammatory changes, mass lesions or obstructive findings. No leaking oral contrast is identified. Significant sigmoid colon diverticulosis but no findings for acute diverticulitis. Vascular/Lymphatic: The aorta is normal in caliber. Mild tortuosity and scattered atherosclerotic calcifications. No periaortic fluid collections. No mesenteric or retroperitoneal mass or adenopathy. Reproductive: The prostate gland and seminal vesicles are unremarkable. Other: Persistent but stable high attenuation fluid throughout the abdomen pelvis worrisome for hemoperitoneum. 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 Could not exclude the possibility that this is leaking. Recommend vascular surgery consultation. Musculoskeletal: No significant bony findings. IMPRESSION: 1. Persistent but stable high attenuation fluid throughout the abdomen pelvis worrisome for hemoperitoneum. 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 Could not exclude the possibility that this is leaking. Recommend vascular surgery consultation. 2. Sigmoid colon diverticulosis without findings for acute diverticulitis. 3. Small renal calculi but no obstructing ureteral calculi or bladder calculi. Electronically Signed   By: Rudie Meyer M.D.   On: 02/07/2020 16:53   CT Renal Stone Study  Result Date: 02/07/2020 CLINICAL DATA:  Flank pain.  Abdominal pain. EXAM: CT ABDOMEN AND  PELVIS WITHOUT CONTRAST TECHNIQUE: Multidetector CT imaging of the abdomen and pelvis was performed following the standard protocol without IV contrast. COMPARISON:  None. FINDINGS: Lower chest: Hazy airspace opacities in bilateral lower lobes. Additional areas of dependent atelectasis. Hepatobiliary: No focal liver abnormality is seen. No gallstones, gallbladder wall thickening, or biliary dilatation. Pancreas: Unremarkable. No pancreatic ductal dilatation or surrounding inflammatory changes. Spleen: Normal in size without focal abnormality. Adrenals/Urinary Tract: Adrenal glands are unremarkable. Kidneys are normal, without renal calculi, focal lesion, or hydronephrosis. Small right peritrigonal bladder diverticulum. Stomach/Bowel: Stomach is within normal limits. The  appendix is not seen. No evidence of bowel wall thickening, distention, or inflammatory changes. Diffuse left colonic diverticulosis without evidence of acute diverticulitis. Vascular/Lymphatic: Aortic atherosclerosis. No enlarged abdominal or pelvic lymph nodes. Reproductive: Mild enlargement of the prostate gland with coarse calcifications. Other: Moderate amount of high density fluid is seen within the abdomen and pelvis. There is a rim calcified 2.1 cm structure adjacent to the tail of the pancreas with surrounding fat stranding, which may represent calcified aneurysm or pseudoaneurysm. Musculoskeletal: No acute or significant osseous findings. IMPRESSION: 1. Moderate amount of high density fluid within the abdomen and pelvis, concerning for hemoperitoneum. Alternatively this may represent a ruptured viscus, however the lack of free gas argues against that. A third consideration would be a radio occult acute pancreatitis. CT angiography, conventional angiography and/or surgical consult is recommended. 2. 2.1 cm rim calcified structure adjacent to the tail of the pancreas with surrounding fat stranding, which may represent calcified vascular  aneurysm or pseudoaneurysm. 3. Diffuse left colonic diverticulosis without evidence of acute diverticulitis. 4. Small right peritrigonal bladder diverticulum. 5. Mild enlargement of the prostate gland with coarse calcifications. Please correlate to serum PSA values. 6. Hazy airspace opacities in the dependent portions of the bilateral lower lobes may represent atelectasis or early airspace consolidation. 7. Aortic atherosclerosis. These results were called by telephone at the time of interpretation on 02/07/2020 at 2:07 pm to provider [facility] , who verbally acknowledged these results. Aortic Atherosclerosis (ICD10-I70.0). Electronically Signed   By: Ted Mcalpine M.D.   On: 02/07/2020 14:13    Review of Systems  Constitutional: Positive for appetite change.  HENT: Negative.   Eyes: Negative.   Respiratory:       Abd pain with deep inspiration  Cardiovascular: Negative.   Gastrointestinal: Positive for abdominal distention, abdominal pain and nausea.  Endocrine: Negative.   Genitourinary: Negative.   Musculoskeletal: Negative.   Skin: Negative.   Allergic/Immunologic: Negative.   Neurological: Positive for light-headedness.  Hematological: Negative.   Psychiatric/Behavioral: Negative.     Physical Exam  Blood pressure (!) 138/99, pulse (!) 101, temperature 97.7 F (36.5 C), temperature source Oral, resp. rate 18, SpO2 98 %.  CONSTITUTIONAL: no acute distress; conversant; no obvious deformities  EYES: conjunctiva moist; no lid lag; anicteric; pupils equal bilaterally  NECK: trachea midline; no thyroid nodularity  LUNGS: respiratory effort normal & unlabored; no wheeze; no rales; no tactile fremitus  CV: rate and rhythm regular; no palpable thrills; no murmur; no edema bilat lower extremities  GI: abdomen with moderate distension; diffuse mild tenderness; soft; no mass; some guarding; no hepatosplenomegaly; no obvious hernia  MSK: normal range of motion of  extremities; no clubbing; no cyanosis  PSYCH: appropriate affect for situation; alert and oriented to person, place, & time  LYMPHATIC: no palpable cervical lymphadenopathy; no evidence lymphedema in extremities   Assessment/Plan:  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  I discussed the CT scan findings with the patient and his wife.  I explained to them that we believe the fluid in the abdomen is likely blood and that it most likely originated from the splenic artery aneurysm.  Interventional radiology has been consulted and will evaluate the patient and review the studies.  Patient will be admitted to the medical service for management including attention to his acute kidney injury.  General surgery will follow closely and be available in case urgent operative intervention would become  necessary, which is unlikely.  Discussed with Adelina Mings, the physician assistant in the emergency department.  Darnell Level, MD [facility] Office: [phone]    Darnell Level 02/07/2020, 6:10 PM

## 2020-02-08 ENCOUNTER — Inpatient Hospital Stay (HOSPITAL_COMMUNITY): Payer: Medicare HMO

## 2020-02-08 ENCOUNTER — Encounter (HOSPITAL_COMMUNITY): Payer: Self-pay | Admitting: Internal Medicine

## 2020-02-08 HISTORY — PX: IR ANGIOGRAM VISCERAL SELECTIVE: IMG657

## 2020-02-08 HISTORY — PX: IR US GUIDE VASC ACCESS RIGHT: IMG2390

## 2020-02-08 HISTORY — PX: IR TRANSCATH PLC STENT 1ST ART NOT LE CV CAR VERT CAR: IMG5443

## 2020-02-08 HISTORY — PX: IR ANGIOGRAM SELECTIVE EACH ADDITIONAL VESSEL: IMG667

## 2020-02-08 HISTORY — PX: IR EMBO ARTERIAL NOT HEMORR HEMANG INC GUIDE ROADMAPPING: IMG5448

## 2020-02-08 LAB — BASIC METABOLIC PANEL
Anion gap: 7 (ref 5–15)
BUN: 19 mg/dL (ref 8–23)
CO2: 28 mmol/L (ref 22–32)
Calcium: 8.3 mg/dL — ABNORMAL LOW (ref 8.9–10.3)
Chloride: 103 mmol/L (ref 98–111)
Creatinine, Ser: 1.09 mg/dL (ref 0.61–1.24)
GFR calc Af Amer: >60 mL/min (ref >60)
GFR calc non Af Amer: >60 mL/min (ref >60)
Glucose, Bld: 113 mg/dL — ABNORMAL HIGH (ref 70–99)
Potassium: 3.8 mmol/L (ref 3.5–5.1)
Sodium: 138 mmol/L (ref 135–145)

## 2020-02-08 LAB — COMPREHENSIVE METABOLIC PANEL
ALT: 23 U/L (ref 0–44)
AST: 20 U/L (ref 15–41)
Albumin: 3.4 g/dL — ABNORMAL LOW (ref 3.5–5.0)
Alkaline Phosphatase: 35 U/L — ABNORMAL LOW (ref 38–126)
Anion gap: 7 (ref 5–15)
BUN: 29 mg/dL — ABNORMAL HIGH (ref 8–23)
CO2: 28 mmol/L (ref 22–32)
Calcium: 8.6 mg/dL — ABNORMAL LOW (ref 8.9–10.3)
Chloride: 102 mmol/L (ref 98–111)
Creatinine, Ser: 1.52 mg/dL — ABNORMAL HIGH (ref 0.61–1.24)
GFR calc Af Amer: 54 mL/min — ABNORMAL LOW (ref >60)
GFR calc non Af Amer: 47 mL/min — ABNORMAL LOW (ref >60)
Glucose, Bld: 115 mg/dL — ABNORMAL HIGH (ref 70–99)
Potassium: 4.4 mmol/L (ref 3.5–5.1)
Sodium: 137 mmol/L (ref 135–145)
Total Bilirubin: 0.8 mg/dL (ref 0.3–1.2)
Total Protein: 5.9 g/dL — ABNORMAL LOW (ref 6.5–8.1)

## 2020-02-08 LAB — CBC
HCT: 28.2 % — ABNORMAL LOW (ref 39.0–52.0)
Hemoglobin: 9.3 g/dL — ABNORMAL LOW (ref 13.0–17.0)
MCH: 34.2 pg — ABNORMAL HIGH (ref 26.0–34.0)
MCHC: 33 g/dL (ref 30.0–36.0)
MCV: 103.7 fL — ABNORMAL HIGH (ref 80.0–100.0)
Platelets: 162 10*3/uL (ref 150–400)
RBC: 2.72 MIL/uL — ABNORMAL LOW (ref 4.22–5.81)
RDW: 12.4 % (ref 11.5–15.5)
WBC: 9.9 10*3/uL (ref 4.0–10.5)
nRBC: 0 % (ref 0.0–0.2)

## 2020-02-08 LAB — HEMOGLOBIN AND HEMATOCRIT, BLOOD
HCT: 27.6 % — ABNORMAL LOW (ref 39.0–52.0)
HCT: 28.6 % — ABNORMAL LOW (ref 39.0–52.0)
HCT: 27.1 % — ABNORMAL LOW (ref 39.0–52.0)
Hemoglobin: 9.2 g/dL — ABNORMAL LOW (ref 13.0–17.0)
Hemoglobin: 9.4 g/dL — ABNORMAL LOW (ref 13.0–17.0)
Hemoglobin: 9 g/dL — ABNORMAL LOW (ref 13.0–17.0)

## 2020-02-08 LAB — ABO/RH: ABO/RH(D): A POS

## 2020-02-08 LAB — HIV ANTIBODY (ROUTINE TESTING W REFLEX): HIV Screen 4th Generation wRfx: NONREACTIVE

## 2020-02-08 MED ORDER — MIDAZOLAM HCL 2 MG/2ML IJ SOLN
INTRAMUSCULAR | Status: AC | PRN
  Administered 2020-02-08: 1 mg via INTRAVENOUS
  Administered 2020-02-08: 0.5 mg via INTRAVENOUS
  Administered 2020-02-08 (×4): 1 mg via INTRAVENOUS
  Administered 2020-02-08: 0.5 mg via INTRAVENOUS
  Administered 2020-02-08 (×3): 1 mg via INTRAVENOUS

## 2020-02-08 MED ORDER — FENTANYL CITRATE (PF) 100 MCG/2ML IJ SOLN
INTRAMUSCULAR | Status: AC
Start: 2020-02-08 — End: 2020-02-09
  Filled 2020-02-08: qty 2

## 2020-02-08 MED ORDER — MIDAZOLAM HCL 2 MG/2ML IJ SOLN
INTRAMUSCULAR | Status: AC
  Filled 2020-02-08: qty 2

## 2020-02-08 MED ORDER — FENTANYL CITRATE (PF) 100 MCG/2ML IJ SOLN
INTRAMUSCULAR | Status: AC | PRN
  Administered 2020-02-08: 25 ug via INTRAVENOUS
  Administered 2020-02-08 (×4): 50 ug via INTRAVENOUS
  Administered 2020-02-08: 25 ug via INTRAVENOUS
  Administered 2020-02-08: 50 ug via INTRAVENOUS

## 2020-02-08 MED ORDER — IODIXANOL 320 MG/ML IV SOLN
50.0000 mL | Freq: Once | INTRAVENOUS | Status: AC | PRN
  Administered 2020-02-08: 35 mL via INTRA_ARTERIAL

## 2020-02-08 MED ORDER — LIDOCAINE HCL (PF) 1 % IJ SOLN
INTRAMUSCULAR | Status: AC | PRN
  Administered 2020-02-08: 10 mL via INTRADERMAL

## 2020-02-08 MED ORDER — IODIXANOL 320 MG/ML IV SOLN
50.0000 mL | Freq: Once | INTRAVENOUS | Status: AC | PRN
  Administered 2020-02-08: 30 mL via INTRA_ARTERIAL

## 2020-02-08 MED ORDER — MIDAZOLAM HCL 2 MG/2ML IJ SOLN
INTRAMUSCULAR | Status: AC
  Filled 2020-02-08: qty 4

## 2020-02-08 MED ORDER — LIDOCAINE HCL 1 % IJ SOLN
INTRAMUSCULAR | Status: AC
  Filled 2020-02-08: qty 20

## 2020-02-08 NOTE — Sedation Documentation (Signed)
Patient is resting comfortably, with eyes closed, in NAD. °

## 2020-02-08 NOTE — Progress Notes (Signed)
Patient requested for medications for indigestion and allergy. Stated he takes them at bedtime. Notified Blount NP without any new order.

## 2020-02-08 NOTE — Progress Notes (Signed)
PROGRESS NOTE    Robert Mullen  NUU:[id number] DOB: Oct 16, 1950 DOA: 02/07/2020 PCP: Vivien Presto, MD    Brief Narrative: HPI per Dr. Gerri Lins on 02/07/2020  69 yr old man. He states that yesterday, while he was in a hot shower he had sudden onset of abdominal pain. He then had dizziness such that he left the shower and went back to bed. He states that he had a very difficult time with pain and lightheadedness yesterday. His symptoms worsened today.  In the ED the patient was found to have Blood pressure of 108/81, normal temperature, tachycardia, and normal oxygen saturation.   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   Labwork demonstrated WBC of 17.7, Hemoglobin of 13.0, platelets of 231, Creatnine was 3.1. It appears that baseline creatinine is 1.0. Anion gap is 14. CBC was repeated 6 hour later after the patient had received IV fluids. WBC was now 14.9, Hemoglobin was now 11.0. Platelets were 193. INR is 1.0.  General surgery and interventional radiology were consulted by EDP. The patient is unable to undergo IR procedure to embolize the bleeding vessel due to his renal insufficiency. Recommendation of general surgery is to type and cross the patient for 4 units PRBC's, monitor H&H, IV fluid, and evaluate for reasons fo renal status.  The patient will be admitted to a step down bed. She will be given IV fluids, renal ultrasound is pending. Consider nephrology consult in the am if no improvement in creatinine.  The patient denies fevers, but he has had chills. Positive for nausea and vomiting and abdominal pain. No diarrhea or constipation. No neurological changes other than the feeling of dizziness.  Assessment & Plan:   Principal Problem:   Splenic artery aneurysm (HCC) Active Problems:   Nontraumatic hemoperitoneum   Acute kidney  injury (nontraumatic) (HCC)   Hemoperitoneum   #1 splenic artery aneurysm-patient admitted with severe abdominal pain CT scan with findings worrisome for hemoperitoneum.  General surgery and IR consulted. Patient scheduled to have mesenteric angiogram and possible embolization today   #2 AKI creatinine 1.5 down from 3 continue  IV hydration  #3 acute blood loss anemia monitor closely  #4 history of peptic ulcer disease-  Estimated body mass index is 23.53 kg/m as calculated from the following:   Height as of this encounter: 6' (1.829 m).   Weight as of this encounter: 78.7 kg.  DVT prophylaxis: SCD  code Status: Full code Family Communication: None at bedside Disposition Plan:  Status is: Inpatient   Dispo: The patient is from: Home              Anticipated d/c is to: Home              Anticipated d/c date is: > 3 days              Patient currently is not medically stable to d/c.   Consultants: Interventional radiology, general surgery  Procedures: None Antimicrobials none  Subjective: Patient wanting to get up and go to the restroom  Advised him to use a bedside commode or use a bedpan  Complaining of abdominal pain  Objective: Vitals:   02/08/20 0636 02/08/20 0800 02/08/20 1126 02/08/20 1200  BP:  (!) 133/93 132/79 (!) 146/78  Pulse:  82 73 76  Resp:  16 13 17   Temp:  98.7 F (37.1  C)  98.4 F (36.9 C)  TempSrc:  Oral  Oral  SpO2:  98% 97% 96%  Weight: 78.7 kg     Height: 6' (1.829 m)       Intake/Output Summary (Last 24 hours) at 02/08/2020 1317 Last data filed at 02/08/2020 1300 Gross per 24 hour  Intake 2000 ml  Output 400 ml  Net 1600 ml   Filed Weights   02/08/20 0636  Weight: 78.7 kg    Examination:  General exam: Appears calm and comfortable  Respiratory system: Clear to auscultation. Respiratory effort normal. Cardiovascular system: S1 & S2 heard, RRR. No JVD, murmurs, rubs, gallops or clicks. No pedal edema. Gastrointestinal system:  Abdomen is distended, soft and nontender. No organomegaly or masses felt. Normal bowel sounds heard. Central nervous system: Alert and oriented. No focal neurological deficits. Extremities: Symmetric 5 x 5 power. Skin: No rashes, lesions or ulcers Psychiatry: Judgement and insight appear normal. Mood & affect appropriate.     Data Reviewed: I have personally reviewed following labs and imaging studies  CBC: Recent Labs  Lab 02/07/20 1152 02/07/20 1732 02/07/20 2109 02/08/20 0308 02/08/20 1137  WBC 17.7* 14.9*  --  9.9  --   HGB 13.0 11.0* 10.5* 9.2*  9.3* 9.4*  HCT 38.5* 32.3* 32.3* 27.6*  28.2* 28.6*  MCV 101.0* 100.9*  --  103.7*  --   PLT 231 193  --  162  --    Basic Metabolic Panel: Recent Labs  Lab 02/07/20 1152 02/08/20 0308  NA 137 137  K 4.6 4.4  CL 98 102  CO2 25 28  GLUCOSE 217* 115*  BUN 33* 29*  CREATININE 3.10* 1.52*  CALCIUM 9.9 8.6*   GFR: Estimated Creatinine Clearance: 51.8 mL/min (A) (by C-G formula based on SCr of 1.52 mg/dL (H)). Liver Function Tests: Recent Labs  Lab 02/07/20 1152 02/08/20 0308  AST 28 20  ALT 31 23  ALKPHOS 50 35*  BILITOT 0.8 0.8  PROT 7.7 5.9*  ALBUMIN 4.7 3.4*   Recent Labs  Lab 02/07/20 1152  LIPASE 22   No results for input(s): AMMONIA in the last 168 hours. Coagulation Profile: Recent Labs  Lab 02/07/20 2109  INR 1.0   Cardiac Enzymes: Recent Labs  Lab 02/07/20 1358  CKTOTAL 54   BNP (last 3 results) No results for input(s): PROBNP in the last 8760 hours. HbA1C: No results for input(s): HGBA1C in the last 72 hours. CBG: No results for input(s): GLUCAP in the last 168 hours. Lipid Profile: No results for input(s): CHOL, HDL, LDLCALC, TRIG, CHOLHDL, LDLDIRECT in the last 72 hours. Thyroid Function Tests: No results for input(s): TSH, T4TOTAL, FREET4, T3FREE, THYROIDAB in the last 72 hours. Anemia Panel: No results for input(s): VITAMINB12, FOLATE, FERRITIN, TIBC, IRON, RETICCTPCT in the last  72 hours. Sepsis Labs: No results for input(s): PROCALCITON, LATICACIDVEN in the last 168 hours.  Recent Results (from the past 240 hour(s))  Respiratory Panel by RT PCR (Flu A&B, Covid) - Nasopharyngeal Swab     Status: None   Collection Time: 02/07/20  2:09 PM   Specimen: Nasopharyngeal Swab  Result Value Ref Range Status   SARS Coronavirus 2 by RT PCR NEGATIVE NEGATIVE Final    Comment: (NOTE) SARS-CoV-2 target nucleic acids are NOT DETECTED.  The SARS-CoV-2 RNA is generally detectable in upper respiratoy specimens during the acute phase of infection. The lowest concentration of SARS-CoV-2 viral copies this assay can detect is 131 copies/mL. A negative result does not  preclude SARS-Cov-2 infection and should not be used as the sole basis for treatment or other patient management decisions. A negative result may occur with  improper specimen collection/handling, submission of specimen other than nasopharyngeal swab, presence of viral mutation(s) within the areas targeted by this assay, and inadequate number of viral copies (<131 copies/mL). A negative result must be combined with clinical observations, patient history, and epidemiological information. The expected result is Negative.  Fact Sheet for Patients:  https://www.moore.com/  Fact Sheet for Healthcare Providers:  https://www.young.biz/  This test is no t yet approved or cleared by the [place] FDA and  has been authorized for detection and/or diagnosis of SARS-CoV-2 by FDA under an Emergency Use Authorization (EUA). This EUA will remain  in effect (meaning this test can be used) for the duration of the COVID-19 declaration under Section 564(b)(1) of the Act, 21 U.S.C. section 360bbb-3(b)(1), unless the authorization is terminated or revoked sooner.     Influenza A by PCR NEGATIVE NEGATIVE Final   Influenza B by PCR NEGATIVE NEGATIVE Final    Comment: (NOTE) The Xpert  Xpress SARS-CoV-2/FLU/RSV assay is intended as an aid in  the diagnosis of influenza from Nasopharyngeal swab specimens and  should not be used as a sole basis for treatment. Nasal washings and  aspirates are unacceptable for Xpert Xpress SARS-CoV-2/FLU/RSV  testing.  Fact Sheet for Patients: https://www.moore.com/  Fact Sheet for Healthcare Providers: https://www.young.biz/  This test is not yet approved or cleared by the [place] FDA and  has been authorized for detection and/or diagnosis of SARS-CoV-2 by  FDA under an Emergency Use Authorization (EUA). This EUA will remain  in effect (meaning this test can be used) for the duration of the  Covid-19 declaration under Section 564(b)(1) of the Act, 21  U.S.C. section 360bbb-3(b)(1), unless the authorization is  terminated or revoked. Performed at [facility], [address] [place], [place] [address]   MRSA PCR Screening     Status: None   Collection Time: 02/07/20  8:25 PM   Specimen: Nasal Mucosa; Nasopharyngeal  Result Value Ref Range Status   MRSA by PCR NEGATIVE NEGATIVE Final    Comment:        The GeneXpert MRSA Assay (FDA approved for NASAL specimens only), is one component of a comprehensive MRSA colonization surveillance program. It is not intended to diagnose MRSA infection nor to guide or monitor treatment for MRSA infections. Performed at [facility], [address], [place], [place] [address]          Radiology Studies: CT ABDOMEN PELVIS WO CONTRAST  Result Date: 02/07/2020 CLINICAL DATA:  GI bleed. Abdominal pain and cramping for 24 hours. Abnormal previous CT scan. EXAM: CT ABDOMEN AND PELVIS WITHOUT CONTRAST TECHNIQUE: Multidetector CT imaging of the abdomen and pelvis was performed following the standard protocol without IV contrast. COMPARISON:  Earlier CT scan, same date. FINDINGS: Lower chest: Streaky bibasilar  atelectasis noted. No pleural effusions or pulmonary infiltrates. Aortic and coronary artery calcifications are noted. Hepatobiliary: No hepatic lesions are identified without contrast. Stable high attenuation perihepatic fluid. The gallbladder is normal. No common bile duct dilatation. Pancreas: No mass, inflammation or ductal dilatation. Spleen: Normal size.  Stable high attenuation perihepatic fluid. Adrenals/Urinary Tract: The adrenal glands are normal and stable. Small renal calculi but no obstructing ureteral calculi or bladder calculi. No worrisome renal or bladder lesions. Small bladder diverticulum noted. Stomach/Bowel: The stomach, duodenum, small bowel and colon are unremarkable. No  acute inflammatory changes, mass lesions or obstructive findings. No leaking oral contrast is identified. Significant sigmoid colon diverticulosis but no findings for acute diverticulitis. Vascular/Lymphatic: The aorta is normal in caliber. Mild tortuosity and scattered atherosclerotic calcifications. No periaortic fluid collections. No mesenteric or retroperitoneal mass or adenopathy. Reproductive: The prostate gland and seminal vesicles are unremarkable. Other: Persistent but stable high attenuation fluid throughout the abdomen pelvis worrisome for hemoperitoneum. 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 Could not exclude the possibility that this is leaking. Recommend vascular surgery consultation. Musculoskeletal: No significant bony findings. IMPRESSION: 1. Persistent but stable high attenuation fluid throughout the abdomen pelvis worrisome for hemoperitoneum. 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 Could not exclude the possibility that this is leaking. Recommend vascular surgery consultation. 2. Sigmoid colon diverticulosis without findings for acute diverticulitis. 3. Small renal calculi but no  obstructing ureteral calculi or bladder calculi. Electronically Signed   By: Rudie MeyerP.  Gallerani M.D.   On: 02/07/2020 16:53   US RENAL  Result Date: 02/08/2020 CLINICAL DATA:  69 year old male with acute renal insufficiency. Suspected hemoperitoneum on recent CT Abdomen and Pelvis. EXAM: RENAL / URINARY TRACT ULTRASOUND COMPLETE COMPARISON:  CT Abdomen and Pelvis 02/07/2020. FINDINGS: Right Kidney: Renal measurements: 11.9 x 5.4 x 5.9 cm = volume: 198 mL. Echogenicity at the upper limits of normal (image 4). No mass or hydronephrosis visualized. Left Kidney: Renal measurements: 13.2 x 6.3 x 6.3 cm = volume: 273 mL. Echogenicity at the upper limits of normal (image 15). No mass or hydronephrosis visualized. Bladder: Appears normal for degree of bladder distention. Other: Free fluid in the both upper quadrants although visible fluid appears fairly simple (images 4 and 13). IMPRESSION: 1. No acute renal finding. Borderline renal cortical echogenicity raising the possibility of some chronic medical renal disease. 2. Small volume of free fluid redemonstrated in both upper quadrants. Electronically Signed   By: Odessa FlemingH  Hall M.D.   On: 02/08/2020 07:57   CT Renal Stone Study  Result Date: 02/07/2020 CLINICAL DATA:  Flank pain.  Abdominal pain. EXAM: CT ABDOMEN AND PELVIS WITHOUT CONTRAST TECHNIQUE: Multidetector CT imaging of the abdomen and pelvis was performed following the standard protocol without IV contrast. COMPARISON:  None. FINDINGS: Lower chest: Hazy airspace opacities in bilateral lower lobes. Additional areas of dependent atelectasis. Hepatobiliary: No focal liver abnormality is seen. No gallstones, gallbladder wall thickening, or biliary dilatation. Pancreas: Unremarkable. No pancreatic ductal dilatation or surrounding inflammatory changes. Spleen: Normal in size without focal abnormality. Adrenals/Urinary Tract: Adrenal glands are unremarkable. Kidneys are normal, without renal calculi, focal lesion, or  hydronephrosis. Small right peritrigonal bladder diverticulum. Stomach/Bowel: Stomach is within normal limits. The appendix is not seen. No evidence of bowel wall thickening, distention, or inflammatory changes. Diffuse left colonic diverticulosis without evidence of acute diverticulitis. Vascular/Lymphatic: Aortic atherosclerosis. No enlarged abdominal or pelvic lymph nodes. Reproductive: Mild enlargement of the prostate gland with coarse calcifications. Other: Moderate amount of high density fluid is seen within the abdomen and pelvis. There is a rim calcified 2.1 cm structure adjacent to the tail of the pancreas with surrounding fat stranding, which may represent calcified aneurysm or pseudoaneurysm. Musculoskeletal: No acute or significant osseous findings. IMPRESSION: 1. Moderate amount of high density fluid within the abdomen and pelvis, concerning for hemoperitoneum. Alternatively this may represent a ruptured viscus, however the lack of free gas argues against that. A third consideration  would be a radio occult acute pancreatitis. CT angiography, conventional angiography and/or surgical consult is recommended. 2. 2.1 cm rim calcified structure adjacent to the tail of the pancreas with surrounding fat stranding, which may represent calcified vascular aneurysm or pseudoaneurysm. 3. Diffuse left colonic diverticulosis without evidence of acute diverticulitis. 4. Small right peritrigonal bladder diverticulum. 5. Mild enlargement of the prostate gland with coarse calcifications. Please correlate to serum PSA values. 6. Hazy airspace opacities in the dependent portions of the bilateral lower lobes may represent atelectasis or early airspace consolidation. 7. Aortic atherosclerosis. These results were called by telephone at the time of interpretation on 02/07/2020 at 2:07 pm to provider [facility] , who verbally acknowledged these results. Aortic Atherosclerosis (ICD10-I70.0). Electronically Signed   By: Ted Mcalpine M.D.   On: 02/07/2020 14:13        Scheduled Meds: . sodium chloride   Intravenous Once  . sodium chloride   Intravenous Once  . acetaminophen  650 mg Oral Once  . Chlorhexidine Gluconate Cloth  6 each Topical Daily  . mouth rinse  15 mL Mouth Rinse BID  . pravastatin  40 mg Oral Daily  . sodium chloride flush  3 mL Intravenous Q12H   Continuous Infusions: . lactated ringers 125 mL/hr at 02/08/20 1300     LOS: 1 day     Alwyn Ren, MD 02/08/2020, 1:17 PM

## 2020-02-08 NOTE — Procedures (Signed)
Interventional Radiology Procedure Note  Procedure:    US guided right CFA access  Embolization of splenic artery aneurysm, for acute hemorrhage.  Proximal coil embolization of the splenic artery, distal and proximal to the aneurysm   Exoseal for hemostasis  Complications: None  Recommendations:  - right hip straight x 4 hrs - PRN pain meds for expected left upper quad pain - serial H&H - Do not submerge for 7 days - Routine wound care   Signed,  Yvone Neu. Loreta Ave, DO

## 2020-02-08 NOTE — Progress Notes (Signed)
Subjective: Patient still with mild lower abdominal pain this am, but overall feeling better.  No dizziness.  Just had his first BM in a couple of days.  Somewhat loose.  ROS: See above, otherwise other systems negative  Objective: Vital signs in last 24 hours: Temp:  [97.7 F (36.5 C)-98.9 F (37.2 C)] 98.5 F (36.9 C) (09/27 0400) Pulse Rate:  [71-115] 82 (09/27 0800) Resp:  [14-29] 16 (09/27 0800) BP: (106-152)/(64-104) 133/93 (09/27 0800) SpO2:  [90 %-100 %] 98 % (09/27 0800) Weight:  [78.7 kg] 78.7 kg (09/27 0636) Last BM Date: 02/08/20  Intake/Output from previous day: 09/26 0701 - 09/27 0700 In: 1000 [IV Piggyback:1000] Out: 200 [Urine:200] Intake/Output this shift: Total I/O In: 375 [I.V.:375] Out: -   PE: Gen: NAD, ambulating well Heart: tachy with mobilization, but normalizes once at rest Lungs: CTAB Abd: soft, minimally tender in central lower abdomen, some BS, minimal distention  Lab Results:  Recent Labs    02/07/20 1732 02/07/20 1732 02/07/20 2109 02/08/20 0308  WBC 14.9*  --   --  9.9  HGB 11.0*   < > 10.5* 9.2*  9.3*  HCT 32.3*   < > 32.3* 27.6*  28.2*  PLT 193  --   --  162   < > = values in this interval not displayed.   BMET Recent Labs    02/07/20 1152 02/08/20 0308  NA 137 137  K 4.6 4.4  CL 98 102  CO2 25 28  GLUCOSE 217* 115*  BUN 33* 29*  CREATININE 3.10* 1.52*  CALCIUM 9.9 8.6*   PT/INR Recent Labs    02/07/20 2109  LABPROT 12.3  INR 1.0   CMP     Component Value Date/Time   NA 137 02/08/2020 0308   K 4.4 02/08/2020 0308   CL 102 02/08/2020 0308   CO2 28 02/08/2020 0308   GLUCOSE 115 (H) 02/08/2020 0308   BUN 29 (H) 02/08/2020 0308   CREATININE 1.52 (H) 02/08/2020 0308   CALCIUM 8.6 (L) 02/08/2020 0308   PROT 5.9 (L) 02/08/2020 0308   ALBUMIN 3.4 (L) 02/08/2020 0308   AST 20 02/08/2020 0308   ALT 23 02/08/2020 0308   ALKPHOS 35 (L) 02/08/2020 0308   BILITOT 0.8 02/08/2020 0308   GFRNONAA 47 (L)  02/08/2020 0308   GFRAA 54 (L) 02/08/2020 0308   Lipase     Component Value Date/Time   LIPASE 22 02/07/2020 1152       Studies/Results: CT ABDOMEN PELVIS WO CONTRAST  Result Date: 02/07/2020 CLINICAL DATA:  GI bleed. Abdominal pain and cramping for 24 hours. Abnormal previous CT scan. EXAM: CT ABDOMEN AND PELVIS WITHOUT CONTRAST TECHNIQUE: Multidetector CT imaging of the abdomen and pelvis was performed following the standard protocol without IV contrast. COMPARISON:  Earlier CT scan, same date. FINDINGS: Lower chest: Streaky bibasilar atelectasis noted. No pleural effusions or pulmonary infiltrates. Aortic and coronary artery calcifications are noted. Hepatobiliary: No hepatic lesions are identified without contrast. Stable high attenuation perihepatic fluid. The gallbladder is normal. No common bile duct dilatation. Pancreas: No mass, inflammation or ductal dilatation. Spleen: Normal size.  Stable high attenuation perihepatic fluid. Adrenals/Urinary Tract: The adrenal glands are normal and stable. Small renal calculi but no obstructing ureteral calculi or bladder calculi. No worrisome renal or bladder lesions. Small bladder diverticulum noted. Stomach/Bowel: The stomach, duodenum, small bowel and colon are unremarkable. No acute inflammatory changes, mass lesions or obstructive findings. No leaking oral contrast  is identified. Significant sigmoid colon diverticulosis but no findings for acute diverticulitis. Vascular/Lymphatic: The aorta is normal in caliber. Mild tortuosity and scattered atherosclerotic calcifications. No periaortic fluid collections. No mesenteric or retroperitoneal mass or adenopathy. Reproductive: The prostate gland and seminal vesicles are unremarkable. Other: Persistent but stable high attenuation fluid throughout the abdomen pelvis worrisome for hemoperitoneum. 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 Could not  exclude the possibility that this is leaking. Recommend vascular surgery consultation. Musculoskeletal: No significant bony findings. IMPRESSION: 1. Persistent but stable high attenuation fluid throughout the abdomen pelvis worrisome for hemoperitoneum. 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 Could not exclude the possibility that this is leaking. Recommend vascular surgery consultation. 2. Sigmoid colon diverticulosis without findings for acute diverticulitis. 3. Small renal calculi but no obstructing ureteral calculi or bladder calculi. Electronically Signed   By: Rudie Meyer M.D.   On: 02/07/2020 16:53   US RENAL  Result Date: 02/08/2020 CLINICAL DATA:  69 year old male with acute renal insufficiency. Suspected hemoperitoneum on recent CT Abdomen and Pelvis. EXAM: RENAL / URINARY TRACT ULTRASOUND COMPLETE COMPARISON:  CT Abdomen and Pelvis 02/07/2020. FINDINGS: Right Kidney: Renal measurements: 11.9 x 5.4 x 5.9 cm = volume: 198 mL. Echogenicity at the upper limits of normal (image 4). No mass or hydronephrosis visualized. Left Kidney: Renal measurements: 13.2 x 6.3 x 6.3 cm = volume: 273 mL. Echogenicity at the upper limits of normal (image 15). No mass or hydronephrosis visualized. Bladder: Appears normal for degree of bladder distention. Other: Free fluid in the both upper quadrants although visible fluid appears fairly simple (images 4 and 13). IMPRESSION: 1. No acute renal finding. Borderline renal cortical echogenicity raising the possibility of some chronic medical renal disease. 2. Small volume of free fluid redemonstrated in both upper quadrants. Electronically Signed   By: Odessa Fleming M.D.   On: 02/08/2020 07:57   CT Renal Stone Study  Result Date: 02/07/2020 CLINICAL DATA:  Flank pain.  Abdominal pain. EXAM: CT ABDOMEN AND PELVIS WITHOUT CONTRAST TECHNIQUE: Multidetector CT imaging of the abdomen and pelvis was performed following the  standard protocol without IV contrast. COMPARISON:  None. FINDINGS: Lower chest: Hazy airspace opacities in bilateral lower lobes. Additional areas of dependent atelectasis. Hepatobiliary: No focal liver abnormality is seen. No gallstones, gallbladder wall thickening, or biliary dilatation. Pancreas: Unremarkable. No pancreatic ductal dilatation or surrounding inflammatory changes. Spleen: Normal in size without focal abnormality. Adrenals/Urinary Tract: Adrenal glands are unremarkable. Kidneys are normal, without renal calculi, focal lesion, or hydronephrosis. Small right peritrigonal bladder diverticulum. Stomach/Bowel: Stomach is within normal limits. The appendix is not seen. No evidence of bowel wall thickening, distention, or inflammatory changes. Diffuse left colonic diverticulosis without evidence of acute diverticulitis. Vascular/Lymphatic: Aortic atherosclerosis. No enlarged abdominal or pelvic lymph nodes. Reproductive: Mild enlargement of the prostate gland with coarse calcifications. Other: Moderate amount of high density fluid is seen within the abdomen and pelvis. There is a rim calcified 2.1 cm structure adjacent to the tail of the pancreas with surrounding fat stranding, which may represent calcified aneurysm or pseudoaneurysm. Musculoskeletal: No acute or significant osseous findings. IMPRESSION: 1. Moderate amount of high density fluid within the abdomen and pelvis, concerning for hemoperitoneum. Alternatively this may represent a ruptured viscus, however the lack of free gas argues against that. A third consideration would be a radio occult acute pancreatitis. CT angiography, conventional angiography and/or  surgical consult is recommended. 2. 2.1 cm rim calcified structure adjacent to the tail of the pancreas with surrounding fat stranding, which may represent calcified vascular aneurysm or pseudoaneurysm. 3. Diffuse left colonic diverticulosis without evidence of acute diverticulitis. 4. Small  right peritrigonal bladder diverticulum. 5. Mild enlargement of the prostate gland with coarse calcifications. Please correlate to serum PSA values. 6. Hazy airspace opacities in the dependent portions of the bilateral lower lobes may represent atelectasis or early airspace consolidation. 7. Aortic atherosclerosis. These results were called by telephone at the time of interpretation on 02/07/2020 at 2:07 pm to provider [facility] , who verbally acknowledged these results. Aortic Atherosclerosis (ICD10-I70.0). Electronically Signed   By: Ted Mcalpine M.D.   On: 02/07/2020 14:13    Anti-infectives: Anti-infectives (From admission, onward)   None       Assessment/Plan ABL anemia - hgb downtrending to 9.2 this am, although hemodynamically stable.  Some of this is likely secondary to leaking aneurysm and some likely secondary to volume resuscitation. 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  Would ideally like to avoid surgery in this area if able -IR on board and following for need for embolization. -cr improved today after hydration -will continue to follow closely   FEN - apparently on [facility] diet VTE - on hold due to above, SCDs ID - none currently needed   LOS: 1 day    Letha Cape , [facility] Surgery 02/08/2020, 8:58 AM Please see Amion for pager number during day hours 7:00am-4:30pm or 7:00am -11:30am on weekends

## 2020-02-08 NOTE — Progress Notes (Signed)
Referring Physician(s): Gerkin,T  Supervising Physician: Gilmer Mor  Patient Status:  [name] - In-pt  Chief Complaint:  Abdominal/pelvic pain/hemoperitoneum  Subjective: Pt cont to have some mild abd/pelvic discomfort;  denies fever, headache, chest pain, dyspnea, cough, nausea, vomiting or visible bleeding; hgb today 9.2, creat 1.52(3.1)  Past Medical History:  Diagnosis Date  . GERD (gastroesophageal reflux disease)   History reviewed. No pertinent surgical history.    Allergies: Amoxicillin-pot clavulanate  Medications: Prior to Admission medications   Medication Sig Start Date End Date Taking? Authorizing Provider  pravastatin (PRAVACHOL) 40 MG tablet Take 1 tablet by mouth daily. 01/19/20  Yes [provider]     Vital Signs: BP (!) 133/93 (BP Location: Left Arm)   Pulse 82   Temp 98.7 F (37.1 C) (Oral)   Resp 16   Ht 6' (1.829 m)   Wt 173 lb 8 oz (78.7 kg)   SpO2 98%   BMI 23.53 kg/m   Physical Exam awake, alert.  Chest clear to auscultation bilaterally.  Heart with regular rate and rhythm.  Abdomen soft, mildly tender mid upper and lower regions to palpation.  No lower extremity edema, intact distal pulses.  Imaging: CT ABDOMEN PELVIS WO CONTRAST  Result Date: 02/07/2020 CLINICAL DATA:  GI bleed. Abdominal pain and cramping for 24 hours. Abnormal previous CT scan. EXAM: CT ABDOMEN AND PELVIS WITHOUT CONTRAST TECHNIQUE: Multidetector CT imaging of the abdomen and pelvis was performed following the standard protocol without IV contrast. COMPARISON:  Earlier CT scan, same date. FINDINGS: Lower chest: Streaky bibasilar atelectasis noted. No pleural effusions or pulmonary infiltrates. Aortic and coronary artery calcifications are noted. Hepatobiliary: No hepatic lesions are identified without contrast. Stable high attenuation perihepatic fluid. The gallbladder is normal. No common bile duct dilatation. Pancreas: No mass, inflammation or ductal  dilatation. Spleen: Normal size.  Stable high attenuation perihepatic fluid. Adrenals/Urinary Tract: The adrenal glands are normal and stable. Small renal calculi but no obstructing ureteral calculi or bladder calculi. No worrisome renal or bladder lesions. Small bladder diverticulum noted. Stomach/Bowel: The stomach, duodenum, small bowel and colon are unremarkable. No acute inflammatory changes, mass lesions or obstructive findings. No leaking oral contrast is identified. Significant sigmoid colon diverticulosis but no findings for acute diverticulitis. Vascular/Lymphatic: The aorta is normal in caliber. Mild tortuosity and scattered atherosclerotic calcifications. No periaortic fluid collections. No mesenteric or retroperitoneal mass or adenopathy. Reproductive: The prostate gland and seminal vesicles are unremarkable. Other: Persistent but stable high attenuation fluid throughout the abdomen pelvis worrisome for hemoperitoneum. 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 Could not exclude the possibility that this is leaking. Recommend vascular surgery consultation. Musculoskeletal: No significant bony findings. IMPRESSION: 1. Persistent but stable high attenuation fluid throughout the abdomen pelvis worrisome for hemoperitoneum. 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 Could not exclude the possibility that this is leaking. Recommend vascular surgery consultation. 2. Sigmoid colon diverticulosis without findings for acute diverticulitis. 3. Small renal calculi but no obstructing ureteral calculi or bladder calculi. Electronically Signed   By: Rudie Meyer M.D.   On: 02/07/2020 16:53   US RENAL  Result Date: 02/08/2020 CLINICAL DATA:  69 year old male with acute renal insufficiency. Suspected hemoperitoneum on recent CT Abdomen and Pelvis. EXAM: RENAL / URINARY TRACT ULTRASOUND COMPLETE  COMPARISON:  CT Abdomen and Pelvis 02/07/2020. FINDINGS: Right Kidney: Renal measurements: 11.9 x 5.4 x 5.9  cm = volume: 198 mL. Echogenicity at the upper limits of normal (image 4). No mass or hydronephrosis visualized. Left Kidney: Renal measurements: 13.2 x 6.3 x 6.3 cm = volume: 273 mL. Echogenicity at the upper limits of normal (image 15). No mass or hydronephrosis visualized. Bladder: Appears normal for degree of bladder distention. Other: Free fluid in the both upper quadrants although visible fluid appears fairly simple (images 4 and 13). IMPRESSION: 1. No acute renal finding. Borderline renal cortical echogenicity raising the possibility of some chronic medical renal disease. 2. Small volume of free fluid redemonstrated in both upper quadrants. Electronically Signed   By: Odessa Fleming M.D.   On: 02/08/2020 07:57   CT Renal Stone Study  Result Date: 02/07/2020 CLINICAL DATA:  Flank pain.  Abdominal pain. EXAM: CT ABDOMEN AND PELVIS WITHOUT CONTRAST TECHNIQUE: Multidetector CT imaging of the abdomen and pelvis was performed following the standard protocol without IV contrast. COMPARISON:  None. FINDINGS: Lower chest: Hazy airspace opacities in bilateral lower lobes. Additional areas of dependent atelectasis. Hepatobiliary: No focal liver abnormality is seen. No gallstones, gallbladder wall thickening, or biliary dilatation. Pancreas: Unremarkable. No pancreatic ductal dilatation or surrounding inflammatory changes. Spleen: Normal in size without focal abnormality. Adrenals/Urinary Tract: Adrenal glands are unremarkable. Kidneys are normal, without renal calculi, focal lesion, or hydronephrosis. Small right peritrigonal bladder diverticulum. Stomach/Bowel: Stomach is within normal limits. The appendix is not seen. No evidence of bowel wall thickening, distention, or inflammatory changes. Diffuse left colonic diverticulosis without evidence of acute diverticulitis. Vascular/Lymphatic: Aortic atherosclerosis. No  enlarged abdominal or pelvic lymph nodes. Reproductive: Mild enlargement of the prostate gland with coarse calcifications. Other: Moderate amount of high density fluid is seen within the abdomen and pelvis. There is a rim calcified 2.1 cm structure adjacent to the tail of the pancreas with surrounding fat stranding, which may represent calcified aneurysm or pseudoaneurysm. Musculoskeletal: No acute or significant osseous findings. IMPRESSION: 1. Moderate amount of high density fluid within the abdomen and pelvis, concerning for hemoperitoneum. Alternatively this may represent a ruptured viscus, however the lack of free gas argues against that. A third consideration would be a radio occult acute pancreatitis. CT angiography, conventional angiography and/or surgical consult is recommended. 2. 2.1 cm rim calcified structure adjacent to the tail of the pancreas with surrounding fat stranding, which may represent calcified vascular aneurysm or pseudoaneurysm. 3. Diffuse left colonic diverticulosis without evidence of acute diverticulitis. 4. Small right peritrigonal bladder diverticulum. 5. Mild enlargement of the prostate gland with coarse calcifications. Please correlate to serum PSA values. 6. Hazy airspace opacities in the dependent portions of the bilateral lower lobes may represent atelectasis or early airspace consolidation. 7. Aortic atherosclerosis. These results were called by telephone at the time of interpretation on 02/07/2020 at 2:07 pm to provider [facility] , who verbally acknowledged these results. Aortic Atherosclerosis (ICD10-I70.0). Electronically Signed   By: Ted Mcalpine M.D.   On: 02/07/2020 14:13    Labs:  CBC: Recent Labs    02/07/20 1152 02/07/20 1732 02/07/20 2109 02/08/20 0308  WBC 17.7* 14.9*  --  9.9  HGB 13.0 11.0* 10.5* 9.2*  9.3*  HCT 38.5* 32.3* 32.3* 27.6*  28.2*  PLT 231 193  --  162    COAGS: Recent Labs    02/07/20 2109  INR 1.0    BMP: Recent Labs      02/07/20 1152 02/08/20 0308  NA 137 137  K 4.6 4.4  CL 98 102  CO2 25 28  GLUCOSE 217* 115*  BUN 33* 29*  CALCIUM 9.9 8.6*  CREATININE 3.10* 1.52*  GFRNONAA 20* 47*  GFRAA 23* 54*    LIVER FUNCTION TESTS: Recent Labs    02/07/20 1152 02/08/20 0308  BILITOT 0.8 0.8  AST 28 20  ALT 31 23  ALKPHOS 50 35*  PROT 7.7 5.9*  ALBUMIN 4.7 3.4*    Assessment and Plan: Patient with prior history of GERD/peptic ulcer disease, now presents with history of acute abdominal pain with prior syncopal episode at home and imaging findings of persistent stable high attenuation fluid throughout the abdomen and pelvis worrisome for hemoperitoneum with epicenter in the left upper quadrant and surrounding a 2 cm splenic artery aneurysm.  See initial consult note by Dr. Miles Costain from yesterday.  BP currently stable, afebrile, hemoglobin 9.2, WBC normal, platelets normal, creatinine 1.52 down from 3.1, PT/INR normal, COVID-19 negative.  Case discussed with [facility] this morning.  No plans for emergent surgery at this time.  Request received for visceral/splenic arteriogram with embolization of splenic artery aneurysm.  Case has been reviewed by Dr. Loreta Ave.Risks and benefits of procedure were discussed with the patient including, but not limited to bleeding, infection, vascular injury or contrast induced renal failure.  This interventional procedure involves the use of X-rays and because of the nature of the planned procedure, it is possible that we will have prolonged use of X-ray fluoroscopy.  Potential radiation risks to you include (but are not limited to) the following: - A slightly elevated risk for cancer  several years later in life. This risk is typically less than 0.5% percent. This risk is low in comparison to the normal incidence of human cancer, which is 33% for women and 50% for men according to the [employer] Society. - Radiation induced injury can include skin redness, resembling a rash,  tissue breakdown / ulcers and hair loss (which can be temporary or permanent).   The likelihood of either of these occurring depends on the difficulty of the procedure and whether you are sensitive to radiation due to previous procedures, disease, or genetic conditions.   IF your procedure requires a prolonged use of radiation, you will be notified and given written instructions for further action.  It is your responsibility to monitor the irradiated area for the 2 weeks following the procedure and to notify your physician if you are concerned that you have suffered a radiation induced injury.    All of the patient's questions were answered, patient is agreeable to proceed.  Consent signed and in chart.  Procedure scheduled for today.    Electronically Signed: D. Jeananne Rama, PA-C 02/08/2020, 10:18 AM   I spent a total of 30 minutes at the the patient's bedside AND on the patient's hospital floor or unit, greater than 50% of which was counseling/coordinating care for visceral/splenic arteriogram with embolization of splenic artery aneurysm    Patient ID: Robert Mullen, male   DOB: [birth year], 69 y.o.   MRN: [id number]

## 2020-02-08 NOTE — TOC Initial Note (Signed)
Transition of Care [facility]) - Initial/Assessment Note    Patient Details  Name: Courtney Fenlon MRN: [id number] Date of Birth: 08-Apr-1951  [facility] CM/SW Contact:    Golda Acre, RN Phone Number: 02/08/2020, 8:59 AM  Clinical Narrative:                 69 yr old man. He states that yesterday, while he was in a hot shower he had sudden onset of abdominal pain. He then had dizziness such that he left the shower and went back to bed. He states that he had a very difficult time with pain and lightheadedness yesterday. His symptoms worsened today.  In the ED the patient was found to have Blood pressure of 108/81, normal temperature, tachycardia, and normal oxygen saturation.   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  Plan-depending on if surgery is indicated for [email or web address].leak,  Following for progression Expected Discharge Plan: Home/Self Care Barriers to Discharge: Continued Medical Work up   Patient Goals and CMS Choice Patient states their goals for this hospitalization and ongoing recovery are:: to go back home CMS Medicare.gov Compare Post Acute Care list provided to:: Patient    Expected Discharge Plan and Services Expected Discharge Plan: Home/Self Care   Discharge Planning Services: CM Consult   Living arrangements for the past 2 months: Single Family Home                                      Prior Living Arrangements/Services Living arrangements for the past 2 months: Single Family Home Lives with:: Spouse Patient language and need for interpreter reviewed:: Yes Do you feel safe going back to the place where you live?: Yes      Need for Family Participation in Patient Care: Yes (Comment) Care giver support system in place?: Yes (comment)   Criminal Activity/Legal Involvement Pertinent to Current  Situation/Hospitalization: No - Comment as needed  Activities of Daily Living Home Assistive Devices/Equipment: Blood pressure cuff, Eyeglasses, Other (Comment) (a scale and a thermometer) ADL Screening (condition at time of admission) Patient's cognitive ability adequate to safely complete daily activities?: Yes Is the patient deaf or have difficulty hearing?: No Does the patient have difficulty seeing, even when wearing glasses/contacts?: No Does the patient have difficulty concentrating, remembering, or making decisions?: No Patient able to express need for assistance with ADLs?: Yes Does the patient have difficulty dressing or bathing?: No Independently performs ADLs?: Yes (appropriate for developmental age) Does the patient have difficulty walking or climbing stairs?: No Weakness of Legs: None Weakness of Arms/Hands: None  Permission Sought/Granted                  Emotional Assessment Appearance:: Appears stated age Attitude/Demeanor/Rapport: Engaged Affect (typically observed): Calm Orientation: : Oriented to Self, Oriented to Place, Oriented to  Time, Oriented to Situation Alcohol / Substance Use: Not Applicable Psych Involvement: No (comment)  Admission diagnosis:  Hemoperitoneum [K66.1] Splenic artery aneurysm (HCC) [I72.8] AKI (acute kidney injury) (HCC) [N17.9] Acute kidney injury (HCC) [N17.9] Patient Active Problem List   Diagnosis Date Noted  . Nontraumatic hemoperitoneum 02/07/2020  . 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 Acute kidney injury (nontraumatic) (HCC) 02/07/2020  . Hemoperitoneum 02/07/2020   PCP:  Vivien Presto, MD Pharmacy:  [id number] - [place], [place] - [address] AT [address] OF [facility] [address] [place] [address] Phone: [phone] Fax: [phone]     Social Determinants of Health (SDOH) Interventions    Readmission Risk Interventions No flowsheet data found.

## 2020-02-09 LAB — COMPREHENSIVE METABOLIC PANEL
ALT: 22 U/L (ref 0–44)
AST: 27 U/L (ref 15–41)
Albumin: 3.6 g/dL (ref 3.5–5.0)
Alkaline Phosphatase: 38 U/L (ref 38–126)
Anion gap: 7 (ref 5–15)
BUN: 18 mg/dL (ref 8–23)
CO2: 28 mmol/L (ref 22–32)
Calcium: 8.3 mg/dL — ABNORMAL LOW (ref 8.9–10.3)
Chloride: 103 mmol/L (ref 98–111)
Creatinine, Ser: 0.97 mg/dL (ref 0.61–1.24)
GFR calc Af Amer: >60 mL/min (ref >60)
GFR calc non Af Amer: >60 mL/min (ref >60)
Glucose, Bld: 118 mg/dL — ABNORMAL HIGH (ref 70–99)
Potassium: 4.3 mmol/L (ref 3.5–5.1)
Sodium: 138 mmol/L (ref 135–145)
Total Bilirubin: 0.8 mg/dL (ref 0.3–1.2)
Total Protein: 5.8 g/dL — ABNORMAL LOW (ref 6.5–8.1)

## 2020-02-09 LAB — URINE CULTURE

## 2020-02-09 LAB — HEMOGLOBIN AND HEMATOCRIT, BLOOD
HCT: 26 % — ABNORMAL LOW (ref 39.0–52.0)
HCT: 28.1 % — ABNORMAL LOW (ref 39.0–52.0)
HCT: 26.4 % — ABNORMAL LOW (ref 39.0–52.0)
Hemoglobin: 8.6 g/dL — ABNORMAL LOW (ref 13.0–17.0)
Hemoglobin: 9.1 g/dL — ABNORMAL LOW (ref 13.0–17.0)
Hemoglobin: 8.4 g/dL — ABNORMAL LOW (ref 13.0–17.0)

## 2020-02-09 MED ORDER — PANTOPRAZOLE SODIUM 40 MG PO TBEC
40.0000 mg | DELAYED_RELEASE_TABLET | Freq: Every day | ORAL | Status: DC
  Administered 2020-02-09 – 2020-02-10 (×2): 40 mg via ORAL
  Filled 2020-02-09 (×2): qty 1

## 2020-02-09 NOTE — Progress Notes (Addendum)
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  He was also found to have AKI which has been resolved. 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  Assessment & Plan:   Principal Problem:   Splenic artery aneurysm (HCC) Active Problems:   Nontraumatic hemoperitoneum   Acute kidney injury (nontraumatic) (HCC)   Hemoperitoneum   #1 splenic artery aneurysm-patient admitted with severe abdominal pain CT scan with findings worrisome for hemoperitoneum.  General surgery and IR consulted. He is status post splenic artery aneurysm embolization on 02/08/2020 by Dr. Loreta Ave with interventional radiology.  His hemoglobin this morning is 8.6 down from 9.0 down from 9.4.  Continue to monitor H&H.   Please order pneumococcus meningococcus and Haemophilus influenza vaccine prior to discharge.  #2 AKI creatinine resolved with IV hydration.  Creatinine 0.97 from 1.5.   #3 acute blood loss anemia monitor closely  #4 history of peptic ulcer disease-not on any medications at home we will put him on Protonix during the hospital stay.  Estimated body mass index is 23.53 kg/m as calculated from the following:   Height as of this encounter: 6' (1.829 m).   Weight as of this encounter: 78.7 kg.  DVT prophylaxis: SCD  code Status: Full code Family Communication: I updated his son on 02/09/2020 Disposition Plan:  Status is: Inpatient   Dispo: The patient is from: Home              Anticipated d/c is to: Home              Anticipated d/c date is:2 days               Patient currently is not medically stable to d/c.  Patient here with splenic artery aneurysm bleeding.  Status post embolization 02/08/2020 need to monitor closely for at least 24 to 48 hours.   Consultants: Interventional radiology, general surgery  Procedures: None Antimicrobials none  Subjective: Patient is resting in bed still complaining of a lot of abdominal pain had 3 loose bowel movements yesterday  Objective: Vitals:   02/09/20 0600 02/09/20 0700 02/09/20 0800 02/09/20 0900  BP: 126/78 106/78 108/75 123/88  Pulse: 91 81 74 69  Resp: 15 (!) 7 (!) 8 18  Temp:   98.1 F (36.7 C)   TempSrc:   Oral   SpO2: 99% 97% 98% 99%  Weight:      Height:        Intake/Output Summary (Last 24 hours) at 02/09/2020 0944 Last data filed at 02/09/2020 0900 Gross per 24 hour  Intake 3052.13 ml  Output 1400 ml  Net 1652.13 ml   Filed Weights   02/08/20 0636  Weight: 78.7 kg    Examination:  General exam: Appears calm and comfortable  Respiratory system: Clear to auscultation. Respiratory effort normal. Cardiovascular system: S1 & S2 heard, RRR. No JVD, murmurs, rubs, gallops or clicks. No pedal edema. Gastrointestinal system: Abdomen is distended, soft and nontender. No organomegaly or masses felt. Normal bowel sounds heard. Central nervous system: Alert and oriented. No focal neurological deficits. Extremities: Symmetric 5 x 5 power. Skin:  No rashes, lesions or ulcers Psychiatry: Judgement and insight appear normal. Mood & affect appropriate.     Data Reviewed: I have personally reviewed following labs and imaging studies  CBC: Recent Labs  Lab 02/07/20 1152 02/07/20 1152 02/07/20 1732 02/07/20 1732 02/07/20 2109 02/08/20 0308 02/08/20 1137 02/08/20 2214 02/09/20 0302  WBC 17.7*  --  14.9*  --   --  9.9  --   --   --   HGB 13.0   < > 11.0*   < > 10.5* 9.2*  9.3* 9.4* 9.0* 8.6*  HCT 38.5*   < > 32.3*   < > 32.3* 27.6*  28.2* 28.6* 27.1* 26.0*  MCV 101.0*  --  100.9*   --   --  103.7*  --   --   --   PLT 231  --  193  --   --  162  --   --   --    < > = values in this interval not displayed.   Basic Metabolic Panel: Recent Labs  Lab 02/07/20 1152 02/08/20 0308 02/08/20 2214 02/09/20 0302  NA 137 137 138 138  K 4.6 4.4 3.8 4.3  CL 98 102 103 103  CO2 25 28 28 28   GLUCOSE 217* 115* 113* 118*  BUN 33* 29* 19 18  CREATININE 3.10* 1.52* 1.09 0.97  CALCIUM 9.9 8.6* 8.3* 8.3*   GFR: Estimated Creatinine Clearance: 81.1 mL/min (by C-G formula based on SCr of 0.97 mg/dL). Liver Function Tests: Recent Labs  Lab 02/07/20 1152 02/08/20 0308 02/09/20 0302  AST 28 20 27   ALT 31 23 22   ALKPHOS 50 35* 38  BILITOT 0.8 0.8 0.8  PROT 7.7 5.9* 5.8*  ALBUMIN 4.7 3.4* 3.6   Recent Labs  Lab 02/07/20 1152  LIPASE 22   No results for input(s): AMMONIA in the last 168 hours. Coagulation Profile: Recent Labs  Lab 02/07/20 2109  INR 1.0   Cardiac Enzymes: Recent Labs  Lab 02/07/20 1358  CKTOTAL 54   BNP (last 3 results) No results for input(s): PROBNP in the last 8760 hours. HbA1C: No results for input(s): HGBA1C in the last 72 hours. CBG: No results for input(s): GLUCAP in the last 168 hours. Lipid Profile: No results for input(s): CHOL, HDL, LDLCALC, TRIG, CHOLHDL, LDLDIRECT in the last 72 hours. Thyroid Function Tests: No results for input(s): TSH, T4TOTAL, FREET4, T3FREE, THYROIDAB in the last 72 hours. Anemia Panel: No results for input(s): VITAMINB12, FOLATE, FERRITIN, TIBC, IRON, RETICCTPCT in the last 72 hours. Sepsis Labs: No results for input(s): PROCALCITON, LATICACIDVEN in the last 168 hours.  Recent Results (from the past 240 hour(s))  Respiratory Panel by RT PCR (Flu A&B, Covid) - Nasopharyngeal Swab     Status: None   Collection Time: 02/07/20  2:09 PM   Specimen: Nasopharyngeal Swab  Result Value Ref Range Status   SARS Coronavirus 2 by RT PCR NEGATIVE NEGATIVE Final    Comment: (NOTE) SARS-CoV-2 target nucleic acids  are NOT DETECTED.  The SARS-CoV-2 RNA is generally detectable in upper respiratoy specimens during the acute phase of infection. The lowest concentration of SARS-CoV-2 viral copies this assay can detect is 131 copies/mL. A negative result does not preclude SARS-Cov-2 infection and should not be used as the sole basis for treatment or other patient management decisions. A negative result may occur with  improper specimen collection/handling, submission of specimen other than nasopharyngeal swab, presence of viral mutation(s) within the areas targeted by this assay, and  inadequate number of viral copies (<131 copies/mL). A negative result must be combined with clinical observations, patient history, and epidemiological information. The expected result is Negative.  Fact Sheet for Patients:  https://www.moore.com/  Fact Sheet for Healthcare Providers:  https://www.young.biz/  This test is no t yet approved or cleared by the [place] FDA and  has been authorized for detection and/or diagnosis of SARS-CoV-2 by FDA under an Emergency Use Authorization (EUA). This EUA will remain  in effect (meaning this test can be used) for the duration of the COVID-19 declaration under Section 564(b)(1) of the Act, 21 U.S.C. section 360bbb-3(b)(1), unless the authorization is terminated or revoked sooner.     Influenza A by PCR NEGATIVE NEGATIVE Final   Influenza B by PCR NEGATIVE NEGATIVE Final    Comment: (NOTE) The Xpert Xpress SARS-CoV-2/FLU/RSV assay is intended as an aid in  the diagnosis of influenza from Nasopharyngeal swab specimens and  should not be used as a sole basis for treatment. Nasal washings and  aspirates are unacceptable for Xpert Xpress SARS-CoV-2/FLU/RSV  testing.  Fact Sheet for Patients: https://www.moore.com/  Fact Sheet for Healthcare Providers: https://www.young.biz/  This test is not  yet approved or cleared by the [place] FDA and  has been authorized for detection and/or diagnosis of SARS-CoV-2 by  FDA under an Emergency Use Authorization (EUA). This EUA will remain  in effect (meaning this test can be used) for the duration of the  Covid-19 declaration under Section 564(b)(1) of the Act, 21  U.S.C. section 360bbb-3(b)(1), unless the authorization is  terminated or revoked. Performed at [facility], [address] [place], [place] [address]   Urine culture     Status: Abnormal   Collection Time: 02/07/20  6:45 PM   Specimen: Urine, Clean Catch  Result Value Ref Range Status   Specimen Description   Final    URINE, CLEAN CATCH Performed at [facility], [address], [place], [place] [address]    Special Requests   Final    NONE Performed at [facility], [address], [place], [place] [address]    Culture MULTIPLE SPECIES PRESENT, SUGGEST RECOLLECTION (A)  Final   Report Status 02/09/2020 FINAL  Final  MRSA PCR Screening     Status: None   Collection Time: 02/07/20  8:25 PM   Specimen: Nasal Mucosa; Nasopharyngeal  Result Value Ref Range Status   MRSA by PCR NEGATIVE NEGATIVE Final    Comment:        The GeneXpert MRSA Assay (FDA approved for NASAL specimens only), is one component of a comprehensive MRSA colonization surveillance program. It is not intended to diagnose MRSA infection nor to guide or monitor treatment for MRSA infections. Performed at [facility], [address], [place], [place] [address]          Radiology Studies: CT ABDOMEN PELVIS WO CONTRAST  Result Date: 02/07/2020 CLINICAL DATA:  GI bleed. Abdominal pain and cramping for 24 hours. Abnormal previous CT scan. EXAM: CT ABDOMEN AND PELVIS WITHOUT CONTRAST TECHNIQUE: Multidetector CT imaging of the abdomen and pelvis was performed following the standard protocol without IV contrast.  COMPARISON:  Earlier CT scan, same date. FINDINGS: Lower chest: Streaky bibasilar atelectasis noted. No pleural effusions or pulmonary infiltrates. Aortic and coronary artery calcifications are noted. Hepatobiliary: No hepatic lesions are identified without contrast. Stable high attenuation perihepatic fluid. The gallbladder is normal. No common bile duct dilatation. Pancreas: No mass, inflammation or  ductal dilatation. Spleen: Normal size.  Stable high attenuation perihepatic fluid. Adrenals/Urinary Tract: The adrenal glands are normal and stable. Small renal calculi but no obstructing ureteral calculi or bladder calculi. No worrisome renal or bladder lesions. Small bladder diverticulum noted. Stomach/Bowel: The stomach, duodenum, small bowel and colon are unremarkable. No acute inflammatory changes, mass lesions or obstructive findings. No leaking oral contrast is identified. Significant sigmoid colon diverticulosis but no findings for acute diverticulitis. Vascular/Lymphatic: The aorta is normal in caliber. Mild tortuosity and scattered atherosclerotic calcifications. No periaortic fluid collections. No mesenteric or retroperitoneal mass or adenopathy. Reproductive: The prostate gland and seminal vesicles are unremarkable. Other: Persistent but stable high attenuation fluid throughout the abdomen pelvis worrisome for hemoperitoneum. 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 Could not exclude the possibility that this is leaking. Recommend vascular surgery consultation. Musculoskeletal: No significant bony findings. IMPRESSION: 1. Persistent but stable high attenuation fluid throughout the abdomen pelvis worrisome for hemoperitoneum. 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 Could not exclude the possibility that this is leaking. Recommend vascular surgery consultation. 2. Sigmoid colon diverticulosis  without findings for acute diverticulitis. 3. Small renal calculi but no obstructing ureteral calculi or bladder calculi. Electronically Signed   By: Rudie Meyer M.D.   On: 02/07/2020 16:53   IR Angiogram Visceral Selective  Result Date: 02/09/2020 INDICATION: 70 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 The antibiotic was administered within 1 hour of the procedure ANESTHESIA/SEDATION: Moderate (conscious) sedation was employed during this procedure. A total of Versed 9.0 mg and Fentanyl 300 mcg was administered intravenously. Moderate Sedation Time: 142 minutes. The patient's level of consciousness and vital signs were monitored continuously by radiology nursing throughout the procedure under my direct supervision. CONTRAST:  Sixty-five cc FLUOROSCOPY TIME:  Fluoroscopy Time: 26 minutes 30 seconds (1859 mGy). COMPLICATIONS: None PROCEDURE: Informed consent was obtained from the patient following explanation of the procedure, risks, benefits and alternatives. The patient understands, agrees and consents for the procedure. All questions were addressed. A time out was performed prior to the initiation of the procedure. Maximal barrier sterile technique utilized including caps, mask, sterile gowns, sterile gloves, large sterile drape, hand hygiene, and Betadine prep. Ultrasound survey of the right inguinal region was performed with images stored and sent to PACs, confirming patency of the vessel. A micropuncture needle was used access the right common femoral artery under ultrasound. With excellent arterial blood flow returned, and an .018 micro wire was passed through the needle, observed enter the abdominal  aorta under fluoroscopy. The needle was removed, and a micropuncture sheath was placed over the wire. The inner dilator and wire were removed, and an 035 Bentson wire was advanced under fluoroscopy into the abdominal aorta. The sheath was removed and a standard 5 [place] vascular sheath was placed. The dilator was removed and the sheath was flushed. Cobra catheter was advanced on the Bentson wire to the abdominal aorta and used to select the celiac artery origin. CO2 angiogram was performed. Glidewire was used to navigate the [place] catheter into the mid segment of the splenic artery. Wire was removed and angiogram was performed. Initial strategy was to enter the saccular aneurysm  for coil embolization. A microcatheter system was advanced through the base catheter, using an STC microcatheter and a 14 fathom wire. We select the largest microcoils available to our service, which were not large enough to remain within the sac of the aneurysm. The micro coils prolapsed into the native vessel. Our second strategy was then to deliver a covered stent given the diameter of the vessel, to cover the neck of the aneurysm. We elected to use a 5 mm by 5 cm covered Gore viabahn. Rose in wire was passed through the base catheter and exchange was made for a 35 cm bright tip 6 French sheath. 65 cm Kumpe the catheter was then advanced through the sheath into the splenic artery. Through the Kumpe the catheter a microcatheter system was advanced into the splenic artery beyond the aneurysm neck. Once we confirmed location with a small angiogram, an 014 transcend wire was placed. On the first attempted delivery of the covered stent, there was ultimately prolapse of the system into the abdominal aorta. Stent was removed, microwire was removed, and exchange was made for a 6 French Brite tip 55 cm sheath. We then again selected the celiac artery with cobra catheter, with serial selection of the splenic artery and then the splenic artery  beyond the aneurysm neck with a microcatheter system. The 65 cm Kumpe the catheter gained purchase within the mid segment of the splenic artery, and the longer, 55 cm Brite tip sheath was advanced into the proximal third of the splenic artery, although we could not advance around the tortuous segment. Again we placed an 0114 transcend wire beyond the neck of the aneurysm. At this point we attempted delivery of the covered stent. The covered stent would not advanced beyond the tortuosity of the mid segment of the splenic artery. The stent was removed and microcatheter was advanced on the transcend wire. We then exchanged for a 014 BMW wire. Another attempt was made to pass the stent. The stent would not pass around the tortuous segment. We then elected to coil embolize the splenic artery in the branches beyond the aneurysm neck and proximal to the aneurysm neck, essentially proximal splenic artery embolization. Once the stent was removed from the system, a penumbra lantern microcatheter was advanced on the BMW wire and the BMW wire was removed. 014 fathom wire was used then to select the downstream artery from the aneurysm neck. A 4 mm penumbra occlusive device was placed. Catheter was withdrawn. We then used the dome of the aneurysm to deflect into the second downstream artery. A 4 mm penumbra occlusive device was placed. Catheter was withdrawn into the more proximal splenic artery. A 6 mm penumbra occlusive device was placed. Additional Ruby coils placed on the proximal aspect of the POD. Final angiogram was performed confirming no flow through the segment. All catheters wires were removed and Exoseal was placed for hemostasis. Patient tolerated procedure well and remained hemodynamically stable throughout. No complications were encountered and no significant blood loss. IMPRESSION: Status post ultrasound guided access right common femoral artery for proximal coil embolization of the splenic artery for trapping and  treatment of ruptured splenic artery aneurysm. ExoSeal deployed for hemostasis. Signed, Yvone NeuJaime S. Reyne DumasWagner, DO, RPVI [facility] [facility] Electronically Signed   By: Gilmer MorJaime  Wagner D.O.   On: 02/09/2020 09:20   IR Angiogram Selective Each Additional Vessel  Result Date: 02/09/2020 INDICATION: 69 year old male with ruptured visceral artery aneurysm, splenic artery, presents for angiogram and possible embolization  EXAM: ULTRASOUND-GUIDED ACCESS RIGHT COMMON FEMORAL ARTERY MESENTERIC ANGIOGRAM TREATMENT OF RUPTURED VISCERAL ARTERY ANEURYSM FROM THE SPLENIC ARTERY WITH PROXIMAL COIL EMBOLIZATION OF THE SPLENIC ARTERY EXOSEAL MEDICATIONS: None. The antibiotic was administered within 1 hour of the procedure ANESTHESIA/SEDATION: Moderate (conscious) sedation was employed during this procedure. A total of Versed 9.0 mg and Fentanyl 300 mcg was administered intravenously. Moderate Sedation Time: 142 minutes. The patient's level of consciousness and vital signs were monitored continuously by radiology nursing throughout the procedure under my direct supervision. CONTRAST:  Sixty-five cc FLUOROSCOPY TIME:  Fluoroscopy Time: 26 minutes 30 seconds (1859 mGy). COMPLICATIONS: None PROCEDURE: Informed consent was obtained from the patient following explanation of the procedure, risks, benefits and alternatives. The patient understands, agrees and consents for the procedure. All questions were addressed. A time out was performed prior to the initiation of the procedure. Maximal barrier sterile technique utilized including caps, mask, sterile gowns, sterile gloves, large sterile drape, hand hygiene, and Betadine prep. Ultrasound survey of the right inguinal region was performed with images stored and sent to PACs, confirming patency of the vessel. A micropuncture needle was used access the right common femoral artery under ultrasound. With excellent arterial blood flow returned, and  an .018 micro wire was passed through the needle, observed enter the abdominal aorta under fluoroscopy. The needle was removed, and a micropuncture sheath was placed over the wire. The inner dilator and wire were removed, and an 035 Bentson wire was advanced under fluoroscopy into the abdominal aorta. The sheath was removed and a standard 5 [place] vascular sheath was placed. The dilator was removed and the sheath was flushed. Cobra catheter was advanced on the Bentson wire to the abdominal aorta and used to select the celiac artery origin. CO2 angiogram was performed. Glidewire was used to navigate the [place] catheter into the mid segment of the splenic artery. Wire was removed and angiogram was performed. Initial strategy was to enter the saccular aneurysm for coil embolization. A microcatheter system was advanced through the base catheter, using an STC microcatheter and a 14 fathom wire. We select the largest microcoils available to our service, which were not large enough to remain within the sac of the aneurysm. The micro coils prolapsed into the native vessel. Our second strategy was then to deliver a covered stent given the diameter of the vessel, to cover the neck of the aneurysm. We elected to use a 5 mm by 5 cm covered Gore viabahn. Rose in wire was passed through the base catheter and exchange was made for a 35 cm bright tip 6 French sheath. 65 cm Kumpe the catheter was then advanced through the sheath into the splenic artery. Through the Kumpe the catheter a microcatheter system was advanced into the splenic artery beyond the aneurysm neck. Once we confirmed location with a small angiogram, an 014 transcend wire was placed. On the first attempted delivery of the covered stent, there was ultimately prolapse of the system into the abdominal aorta. Stent was removed, microwire was removed, and exchange was made for a 6 French Brite tip 55 cm sheath. We then again selected the celiac artery with cobra  catheter, with serial selection of the splenic artery and then the splenic artery beyond the aneurysm neck with a microcatheter system. The 65 cm Kumpe the catheter gained purchase within the mid segment of the splenic artery, and the longer, 55 cm Brite tip sheath was advanced into the proximal third of the splenic artery, although we could not advance  around the tortuous segment. Again we placed an 0114 transcend wire beyond the neck of the aneurysm. At this point we attempted delivery of the covered stent. The covered stent would not advanced beyond the tortuosity of the mid segment of the splenic artery. The stent was removed and microcatheter was advanced on the transcend wire. We then exchanged for a 014 BMW wire. Another attempt was made to pass the stent. The stent would not pass around the tortuous segment. We then elected to coil embolize the splenic artery in the branches beyond the aneurysm neck and proximal to the aneurysm neck, essentially proximal splenic artery embolization. Once the stent was removed from the system, a penumbra lantern microcatheter was advanced on the BMW wire and the BMW wire was removed. 014 fathom wire was used then to select the downstream artery from the aneurysm neck. A 4 mm penumbra occlusive device was placed. Catheter was withdrawn. We then used the dome of the aneurysm to deflect into the second downstream artery. A 4 mm penumbra occlusive device was placed. Catheter was withdrawn into the more proximal splenic artery. A 6 mm penumbra occlusive device was placed. Additional Ruby coils placed on the proximal aspect of the POD. Final angiogram was performed confirming no flow through the segment. All catheters wires were removed and Exoseal was placed for hemostasis. Patient tolerated procedure well and remained hemodynamically stable throughout. No complications were encountered and no significant blood loss. IMPRESSION: Status post ultrasound guided access right common  femoral artery for proximal coil embolization of the splenic artery for trapping and treatment of ruptured splenic artery aneurysm. ExoSeal deployed for hemostasis. Signed, Yvone Neu. Reyne Dumas, RPVI [facility] Radiology Electronically Signed   By: Gilmer Mor D.O.   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 The antibiotic was administered within 1 hour of the procedure ANESTHESIA/SEDATION: Moderate (conscious) sedation was employed during this procedure. A total of Versed 9.0 mg and Fentanyl 300 mcg was administered intravenously. Moderate Sedation Time: 142 minutes. The patient's level of consciousness and vital signs were monitored continuously by radiology nursing throughout the procedure under my direct supervision. CONTRAST:  Sixty-five cc FLUOROSCOPY TIME:  Fluoroscopy Time: 26 minutes 30 seconds (1859 mGy). COMPLICATIONS: None PROCEDURE: Informed consent was obtained from the patient following explanation of the procedure, risks, benefits and alternatives. The patient understands, agrees and consents for the procedure. All questions were addressed. A time out was performed prior to the initiation of the procedure. Maximal barrier sterile technique utilized including caps, mask, sterile gowns, sterile gloves, large sterile drape, hand hygiene, and Betadine prep. Ultrasound survey of the right inguinal region was performed with images stored and sent to PACs, confirming patency of the vessel. A micropuncture needle was used access the right  common femoral artery under ultrasound. With excellent arterial blood flow returned, and an .018 micro wire was passed through the needle, observed enter the abdominal aorta under fluoroscopy. The needle was removed, and a micropuncture sheath was placed over the wire. The inner dilator and wire were removed, and an 035 Bentson wire was advanced under fluoroscopy into the abdominal aorta. The sheath was removed and a standard  5 [place] vascular sheath was placed. The dilator was removed and the sheath was flushed. Cobra catheter was advanced on the Bentson wire to the abdominal aorta and used to select the celiac artery origin. CO2 angiogram was performed. Glidewire was used to navigate the Blakely catheter into the mid segment of the splenic artery. Wire was removed and angiogram was performed. Initial strategy was to enter the saccular aneurysm for coil embolization. A microcatheter system was advanced through the base catheter, using an STC microcatheter and a 14 fathom wire. We select the largest microcoils available to our service, which were not large enough to remain within the sac of the aneurysm. The micro coils prolapsed into the native vessel. Our second strategy was then to deliver a covered stent given the diameter of the vessel, to cover the neck of the aneurysm. We elected to use a 5 mm by 5 cm covered Gore viabahn. Rose in wire was passed through the base catheter and exchange was made for a 35 cm bright tip 6 French sheath. 65 cm Kumpe the catheter was then advanced through the sheath into the splenic artery. Through the Kumpe the catheter a microcatheter system was advanced into the splenic artery beyond the aneurysm neck. Once we confirmed location with a small angiogram, an 014 transcend wire was placed. On the first attempted delivery of the covered stent, there was ultimately prolapse of the system into the abdominal aorta. Stent was removed, microwire was removed, and exchange was made for a 6  French Brite tip 55 cm sheath. We then again selected the celiac artery with cobra catheter, with serial selection of the splenic artery and then the splenic artery beyond the aneurysm neck with a microcatheter system. The 65 cm Kumpe the catheter gained purchase within the mid segment of the splenic artery, and the longer, 55 cm Brite tip sheath was advanced into the proximal third of the splenic artery, although we could not advance around the tortuous segment. Again we placed an 0114 transcend wire beyond the neck of the aneurysm. At this point we attempted delivery of the covered stent. The covered stent would not advanced beyond the tortuosity of the mid segment of the splenic artery. The stent was removed and microcatheter was advanced on the transcend wire. We then exchanged for a 014 BMW wire. Another attempt was made to pass the stent. The stent would not pass around the tortuous segment. We then elected to coil embolize the splenic artery in the branches beyond the aneurysm neck and proximal to the aneurysm neck, essentially proximal splenic artery embolization. Once the stent was removed from the system, a penumbra lantern microcatheter was advanced on the BMW wire and the BMW wire was removed. 014 fathom wire was used then to select the downstream artery from the aneurysm neck. A 4 mm penumbra occlusive device was placed. Catheter was withdrawn. We then used the dome of the aneurysm to deflect into the second downstream artery. A 4 mm penumbra occlusive device was placed. Catheter was withdrawn into the more proximal splenic artery. A 6 mm penumbra occlusive device was placed. Additional Ruby coils placed on the proximal aspect of the POD. Final angiogram was performed confirming no flow through the segment. All catheters wires were removed and Exoseal was placed for hemostasis. Patient tolerated procedure well and remained hemodynamically stable throughout. No complications were encountered and no  significant blood loss. 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 ExoSeal deployed for hemostasis. Signed, Yvone Neu. Reyne Dumas, RPVI [facility] Radiology Electronically Signed   By: Gilmer Mor D.O.   On: 02/09/2020 09:20   US RENAL  Result Date: 02/08/2020 CLINICAL DATA:  69 year old male with acute renal insufficiency. Suspected hemoperitoneum on recent CT Abdomen and Pelvis. EXAM: RENAL / URINARY TRACT ULTRASOUND COMPLETE COMPARISON:  CT Abdomen and Pelvis 02/07/2020. FINDINGS: Right Kidney: Renal measurements: 11.9 x 5.4 x 5.9 cm = volume: 198 mL. Echogenicity at the upper limits of normal (image 4). No mass or hydronephrosis visualized. Left Kidney: Renal measurements: 13.2 x 6.3 x 6.3 cm = volume: 273 mL. Echogenicity at the upper limits of normal (image 15). No mass or hydronephrosis visualized. Bladder: Appears normal for degree of bladder distention. Other: Free fluid in the both upper quadrants although visible fluid appears fairly simple (images 4 and 13). IMPRESSION: 1. No acute renal finding. Borderline renal cortical echogenicity raising the possibility of some chronic medical renal disease. 2. Small volume of free fluid redemonstrated in both upper quadrants. Electronically Signed   By: Odessa Fleming M.D.   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 The antibiotic was administered within 1 hour of the  procedure ANESTHESIA/SEDATION: Moderate (conscious) sedation was employed during this procedure. A total of Versed 9.0 mg and Fentanyl 300 mcg was administered intravenously. Moderate Sedation Time: 142 minutes. The patient's level of consciousness and vital signs were monitored continuously by radiology nursing throughout the procedure under my direct supervision. CONTRAST:  Sixty-five cc FLUOROSCOPY TIME:  Fluoroscopy Time: 26 minutes 30 seconds (1859 mGy). COMPLICATIONS: None PROCEDURE: Informed consent was obtained from the patient following explanation of the procedure, risks, benefits and alternatives. The patient understands, agrees and consents for the procedure. All questions were addressed. A time out was performed prior to the initiation of the procedure. Maximal barrier sterile technique utilized including caps, mask, sterile gowns, sterile gloves, large sterile drape, hand hygiene, and Betadine prep. Ultrasound survey of the right inguinal region was performed with images stored and sent to PACs, confirming patency of the vessel. A micropuncture needle was used access the right common femoral artery under ultrasound. With excellent arterial blood flow returned, and an .018 micro wire was passed through the needle, observed enter the abdominal aorta under fluoroscopy. The needle was removed, and a micropuncture sheath was placed over the wire. The inner dilator and wire were removed, and an 035 Bentson wire was advanced under fluoroscopy into the abdominal aorta. The sheath was removed and a standard 5 [place] vascular sheath was placed. The dilator was removed and the sheath was flushed. Cobra catheter was advanced on the Bentson wire to the abdominal aorta and used to select the celiac artery origin. CO2 angiogram was performed. Glidewire was used to navigate the [place] catheter into the mid segment of the splenic artery. Wire was removed and angiogram was performed. Initial strategy was to enter the  saccular aneurysm for coil embolization. A microcatheter system was advanced through the base catheter, using an STC microcatheter and a 14 fathom wire. We select the largest microcoils available to  our service, which were not large enough to remain within the sac of the aneurysm. The micro coils prolapsed into the native vessel. Our second strategy was then to deliver a covered stent given the diameter of the vessel, to cover the neck of the aneurysm. We elected to use a 5 mm by 5 cm covered Gore viabahn. Rose in wire was passed through the base catheter and exchange was made for a 35 cm bright tip 6 French sheath. 65 cm Kumpe the catheter was then advanced through the sheath into the splenic artery. Through the Kumpe the catheter a microcatheter system was advanced into the splenic artery beyond the aneurysm neck. Once we confirmed location with a small angiogram, an 014 transcend wire was placed. On the first attempted delivery of the covered stent, there was ultimately prolapse of the system into the abdominal aorta. Stent was removed, microwire was removed, and exchange was made for a 6 French Brite tip 55 cm sheath. We then again selected the celiac artery with cobra catheter, with serial selection of the splenic artery and then the splenic artery beyond the aneurysm neck with a microcatheter system. The 65 cm Kumpe the catheter gained purchase within the mid segment of the splenic artery, and the longer, 55 cm Brite tip sheath was advanced into the proximal third of the splenic artery, although we could not advance around the tortuous segment. Again we placed an 0114 transcend wire beyond the neck of the aneurysm. At this point we attempted delivery of the covered stent. The covered stent would not advanced beyond the tortuosity of the mid segment of the splenic artery. The stent was removed and microcatheter was advanced on the transcend wire. We then exchanged for a 014 BMW wire. Another attempt was made to  pass the stent. The stent would not pass around the tortuous segment. We then elected to coil embolize the splenic artery in the branches beyond the aneurysm neck and proximal to the aneurysm neck, essentially proximal splenic artery embolization. Once the stent was removed from the system, a penumbra lantern microcatheter was advanced on the BMW wire and the BMW wire was removed. 014 fathom wire was used then to select the downstream artery from the aneurysm neck. A 4 mm penumbra occlusive device was placed. Catheter was withdrawn. We then used the dome of the aneurysm to deflect into the second downstream artery. A 4 mm penumbra occlusive device was placed. Catheter was withdrawn into the more proximal splenic artery. A 6 mm penumbra occlusive device was placed. Additional Ruby coils placed on the proximal aspect of the POD. Final angiogram was performed confirming no flow through the segment. All catheters wires were removed and Exoseal was placed for hemostasis. Patient tolerated procedure well and remained hemodynamically stable throughout. No complications were encountered and no significant blood loss. 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 ExoSeal deployed for hemostasis. Signed, Yvone Neu. Reyne Dumas, RPVI [facility] Inter[facility] Radiology Specialists [facility] Radiology Electronically Signed   By: Gilmer Mor D.O.   On: 02/09/2020 09:20   CT Renal Stone Study  Result Date: 02/07/2020 CLINICAL DATA:  Flank pain.  Abdominal pain. EXAM: CT ABDOMEN AND PELVIS WITHOUT CONTRAST TECHNIQUE: Multidetector CT imaging of the abdomen and pelvis was performed following the standard protocol without IV contrast. COMPARISON:  None. FINDINGS: Lower chest: Hazy airspace opacities in bilateral lower lobes. Additional  areas of dependent atelectasis. Hepatobiliary:  No focal liver abnormality is seen. No gallstones, gallbladder wall thickening, or biliary dilatation. Pancreas: Unremarkable. No pancreatic ductal dilatation or surrounding inflammatory changes. Spleen: Normal in size without focal abnormality. Adrenals/Urinary Tract: Adrenal glands are unremarkable. Kidneys are normal, without renal calculi, focal lesion, or hydronephrosis. Small right peritrigonal bladder diverticulum. Stomach/Bowel: Stomach is within normal limits. The appendix is not seen. No evidence of bowel wall thickening, distention, or inflammatory changes. Diffuse left colonic diverticulosis without evidence of acute diverticulitis. Vascular/Lymphatic: Aortic atherosclerosis. No enlarged abdominal or pelvic lymph nodes. Reproductive: Mild enlargement of the prostate gland with coarse calcifications. Other: Moderate amount of high density fluid is seen within the abdomen and pelvis. There is a rim calcified 2.1 cm structure adjacent to the tail of the pancreas with surrounding fat stranding, which may represent calcified aneurysm or pseudoaneurysm. Musculoskeletal: No acute or significant osseous findings. IMPRESSION: 1. Moderate amount of high density fluid within the abdomen and pelvis, concerning for hemoperitoneum. Alternatively this may represent a ruptured viscus, however the lack of free gas argues against that. A third consideration would be a radio occult acute pancreatitis. CT angiography, conventional angiography and/or surgical consult is recommended. 2. 2.1 cm rim calcified structure adjacent to the tail of the pancreas with surrounding fat stranding, which may represent calcified vascular aneurysm or pseudoaneurysm. 3. Diffuse left colonic diverticulosis without evidence of acute diverticulitis. 4. Small right peritrigonal bladder diverticulum. 5. Mild enlargement of the prostate gland with coarse calcifications. Please correlate to serum PSA values. 6. Hazy airspace opacities in the  dependent portions of the bilateral lower lobes may represent atelectasis or early airspace consolidation. 7. Aortic atherosclerosis. These results were called by telephone at the time of interpretation on 02/07/2020 at 2:07 pm to provider [facility] , who verbally acknowledged these results. Aortic Atherosclerosis (ICD10-I70.0). Electronically Signed   By: Ted Mcalpine M.D.   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 The antibiotic was administered within 1 hour of the procedure ANESTHESIA/SEDATION: Moderate (conscious) sedation was employed during this procedure. A total of Versed 9.0 mg and Fentanyl 300 mcg was administered intravenously. Moderate Sedation Time: 142 minutes. The patient's level of consciousness and vital signs were monitored continuously by radiology nursing throughout the procedure under my direct supervision. CONTRAST:  Sixty-five cc FLUOROSCOPY TIME:  Fluoroscopy Time: 26 minutes 30 seconds (1859 mGy). COMPLICATIONS: None PROCEDURE: Informed consent was obtained from the patient following explanation of the procedure, risks, benefits and alternatives. The patient understands, agrees and consents for the procedure. All questions were addressed. A time out was performed prior to the initiation of the procedure. Maximal barrier sterile technique utilized including caps, mask, sterile gowns, sterile gloves, large sterile drape, hand hygiene, and Betadine prep. Ultrasound survey of the right inguinal region was performed with images stored and sent to PACs, confirming  patency of the vessel. A micropuncture needle was used access the right common femoral artery under ultrasound. With excellent arterial blood flow returned, and an .018 micro wire was passed through the needle, observed enter the abdominal aorta under fluoroscopy. The needle was removed, and a micropuncture sheath was placed over  the wire. The inner dilator and wire were removed, and an 035 Bentson wire was advanced under fluoroscopy into the abdominal aorta. The sheath was removed and a standard 5 [place] vascular sheath was placed. The dilator was removed and the sheath was flushed. Cobra catheter was advanced on the Bentson wire to the abdominal aorta and used to select the celiac artery origin. CO2 angiogram was performed. Glidewire was used to navigate the [place] catheter into the mid segment of the splenic artery. Wire was removed and angiogram was performed. Initial strategy was to enter the saccular aneurysm for coil embolization. A microcatheter system was advanced through the base catheter, using an STC microcatheter and a 14 fathom wire. We select the largest microcoils available to our service, which were not large enough to remain within the sac of the aneurysm. The micro coils prolapsed into the native vessel. Our second strategy was then to deliver a covered stent given the diameter of the vessel, to cover the neck of the aneurysm. We elected to use a 5 mm by 5 cm covered Gore viabahn. Rose in wire was passed through the base catheter and exchange was made for a 35 cm bright tip 6 French sheath. 65 cm Kumpe the catheter was then advanced through the sheath into the splenic artery. Through the Kumpe the catheter a microcatheter system was advanced into the splenic artery beyond the aneurysm neck. Once we confirmed location with a small angiogram, an 014 transcend wire was placed. On the first attempted delivery of the covered stent, there was ultimately prolapse of the system into the abdominal aorta.  Stent was removed, microwire was removed, and exchange was made for a 6 French Brite tip 55 cm sheath. We then again selected the celiac artery with cobra catheter, with serial selection of the splenic artery and then the splenic artery beyond the aneurysm neck with a microcatheter system. The 65 cm Kumpe the catheter gained purchase within the mid segment of the splenic artery, and the longer, 55 cm Brite tip sheath was advanced into the proximal third of the splenic artery, although we could not advance around the tortuous segment. Again we placed an 0114 transcend wire beyond the neck of the aneurysm. At this point we attempted delivery of the covered stent. The covered stent would not advanced beyond the tortuosity of the mid segment of the splenic artery. The stent was removed and microcatheter was advanced on the transcend wire. We then exchanged for a 014 BMW wire. Another attempt was made to pass the stent. The stent would not pass around the tortuous segment. We then elected to coil embolize the splenic artery in the branches beyond the aneurysm neck and proximal to the aneurysm neck, essentially proximal splenic artery embolization. Once the stent was removed from the system, a penumbra lantern microcatheter was advanced on the BMW wire and the BMW wire was removed. 014 fathom wire was used then to select the downstream artery from the aneurysm neck. A 4 mm penumbra occlusive device was placed. Catheter was withdrawn. We then used the dome of the aneurysm to deflect into the second downstream artery. A 4 mm penumbra occlusive device was placed. Catheter was withdrawn into the more proximal splenic artery. A 6 mm penumbra occlusive device was placed. Additional Ruby coils placed on the proximal aspect of the POD. Final angiogram was performed confirming no flow through the segment. All catheters wires were removed and Exoseal was placed for hemostasis. Patient tolerated procedure well and remained  hemodynamically stable throughout. No complications were encountered and no significant blood loss. 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 ExoSeal deployed for hemostasis. Signed, Yvone Neu. Reyne Dumas, RPVI [facility] and Interventional Radiology Specialists [facility] Radiology Electronically Signed   By: Gilmer Mor D.O.   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 The antibiotic was administered within 1 hour of the procedure ANESTHESIA/SEDATION: Moderate (conscious) sedation was employed during this procedure. A total of Versed 9.0 mg and Fentanyl 300 mcg was administered intravenously. Moderate Sedation Time: 142 minutes. The patient's level of consciousness and vital signs were monitored continuously by radiology nursing throughout the procedure under my direct supervision. CONTRAST:  Sixty-five cc FLUOROSCOPY TIME:  Fluoroscopy Time: 26 minutes 30 seconds (1859 mGy). COMPLICATIONS: None PROCEDURE: Informed consent was obtained from the patient following explanation of the procedure, risks, benefits and alternatives. The patient understands, agrees and consents for the procedure. All questions were addressed. A time out was performed prior to the initiation of the procedure. Maximal barrier sterile technique utilized including caps, mask, sterile gowns, sterile gloves, large sterile drape, hand hygiene, and Betadine prep. Ultrasound  survey of the right inguinal region was performed with images stored and sent to PACs, confirming patency of the vessel. A micropuncture needle was used access the right common femoral artery under ultrasound. With excellent arterial blood flow returned, and an .018 micro wire was passed through the needle, observed enter the abdominal aorta under fluoroscopy. The needle was removed, and a micropuncture sheath was placed over the wire. The inner dilator and wire were removed, and an 035 Bentson wire was advanced under fluoroscopy into the abdominal aorta. The sheath was removed and a standard 5 [place] vascular sheath was placed. The dilator was removed and the sheath was flushed. Cobra catheter was advanced on the Bentson wire to the abdominal aorta and used to select the celiac artery origin. CO2 angiogram was performed. Glidewire was used to navigate the [place] catheter into the mid segment of the splenic artery. Wire was removed and angiogram was performed. Initial strategy was to enter the saccular aneurysm for coil embolization. A microcatheter system was advanced through the base catheter, using an STC microcatheter and a 14 fathom wire. We select the largest microcoils available to our service, which were not large enough to remain within the sac of the aneurysm. The micro coils prolapsed into the native vessel. Our second strategy was then to deliver a covered stent given the diameter of the vessel, to cover the neck of the aneurysm. We elected to use a 5 mm by 5 cm covered Gore viabahn. Rose in wire was passed through the base catheter and exchange was made for a 35 cm bright tip 6 French sheath. 65 cm Kumpe the catheter was then advanced through the sheath into the splenic artery. Through the Kumpe the catheter a microcatheter system was advanced into the splenic artery beyond the aneurysm neck. Once we confirmed location with a small angiogram, an 014 transcend wire was placed. 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 Stent was removed, microwire was removed, and exchange was made for a 6 French Brite tip 55 cm sheath. We then again selected the celiac artery with cobra catheter, with serial selection of the splenic artery and then the splenic artery beyond the aneurysm neck with a microcatheter system. The 65 cm Kumpe the catheter gained purchase within the mid segment of the splenic artery, and the longer, 55 cm Brite tip sheath was advanced into the proximal third of the splenic artery, although we could not advance around the tortuous segment. Again we placed an 0114 transcend wire beyond the neck of the aneurysm. At this point we attempted delivery of the covered stent. The covered stent would not advanced beyond the tortuosity of the mid segment of the splenic artery. The stent was removed and microcatheter was advanced on the transcend wire. We then exchanged for a 014 BMW wire. Another attempt was made to pass the stent. The stent would not pass around the tortuous segment. We then elected to coil embolize the splenic artery in the branches beyond the aneurysm neck and proximal to the aneurysm neck, essentially proximal splenic artery embolization. Once the stent was removed from the system, a penumbra lantern microcatheter was advanced on the BMW wire and the BMW wire was removed. 014 fathom wire was used then to select the downstream artery from the aneurysm neck. A 4 mm penumbra occlusive device was placed. Catheter was withdrawn. We then used the dome of the aneurysm to deflect into the second downstream artery. A 4 mm penumbra occlusive device was placed. Catheter was withdrawn into the more proximal splenic artery. A 6 mm penumbra occlusive device was placed. Additional Ruby coils placed on the proximal aspect of the POD. Final angiogram was performed confirming no flow through the segment. All catheters wires were  removed and Exoseal was placed for hemostasis. Patient tolerated procedure well and remained hemodynamically stable throughout. No complications were encountered and no significant blood loss. 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 ExoSeal deployed for hemostasis. Signed, Yvone Neu. Reyne Dumas, RPVI [facility] Radiology Electronically Signed   By: Gilmer Mor D.O.   On: 02/09/2020 09:20        Scheduled Meds: . sodium chloride   Intravenous Once  . sodium chloride   Intravenous Once  . acetaminophen  650 mg Oral Once  . Chlorhexidine Gluconate Cloth  6 each Topical Daily  . mouth rinse  15 mL Mouth Rinse BID  . pravastatin  40 mg Oral Daily  . sodium chloride flush  3 mL Intravenous Q12H   Continuous Infusions: . lactated ringers 125 mL/hr at 02/09/20 0900     LOS: 2 days     Alwyn Ren, MD 02/09/2020, 9:44 AM

## 2020-02-09 NOTE — Progress Notes (Signed)
Subjective: Had a lot of pain post embolization as expected.  Feeling a little better this am but still drowsy from pain meds.  Passing flatus, no nausea.    ROS: See above, otherwise other systems negative  Objective: Vital signs in last 24 hours: Temp:  [98.1 F (36.7 C)-98.8 F (37.1 C)] 98.1 F (36.7 C) (09/28 0800) Pulse Rate:  [56-91] 69 (09/28 0900) Resp:  [0-22] 18 (09/28 0900) BP: (97-171)/(66-107) 123/88 (09/28 0900) SpO2:  [90 %-100 %] 99 % (09/28 0900) Last BM Date: 02/08/20  Intake/Output from previous day: 09/27 0701 - 09/28 0700 In: 1250 [I.V.:1250] Out: 1400 [Urine:1400] Intake/Output this shift: Total I/O In: 2177.1 [I.V.:2177.1] Out: -   PE: Heart: regular Lungs: CTAB Abd: soft, mild diffuse tenderness, but +BS, ND  Lab Results:  Recent Labs    02/07/20 1732 02/07/20 2109 02/08/20 0308 02/08/20 1137 02/08/20 2214 02/09/20 0302  WBC 14.9*  --  9.9  --   --   --   HGB 11.0*   < > 9.2*  9.3*   < > 9.0* 8.6*  HCT 32.3*   < > 27.6*  28.2*   < > 27.1* 26.0*  PLT 193  --  162  --   --   --    < > = values in this interval not displayed.   BMET Recent Labs    02/08/20 2214 02/09/20 0302  NA 138 138  K 3.8 4.3  CL 103 103  CO2 28 28  GLUCOSE 113* 118*  BUN 19 18  CREATININE 1.09 0.97  CALCIUM 8.3* 8.3*   PT/INR Recent Labs    02/07/20 2109  LABPROT 12.3  INR 1.0   CMP     Component Value Date/Time   NA 138 02/09/2020 0302   K 4.3 02/09/2020 0302   CL 103 02/09/2020 0302   CO2 28 02/09/2020 0302   GLUCOSE 118 (H) 02/09/2020 0302   BUN 18 02/09/2020 0302   CREATININE 0.97 02/09/2020 0302   CALCIUM 8.3 (L) 02/09/2020 0302   PROT 5.8 (L) 02/09/2020 0302   ALBUMIN 3.6 02/09/2020 0302   AST 27 02/09/2020 0302   ALT 22 02/09/2020 0302   ALKPHOS 38 02/09/2020 0302   BILITOT 0.8 02/09/2020 0302   GFRNONAA >60 02/09/2020 0302   GFRAA >60 02/09/2020 0302   Lipase     Component Value Date/Time   LIPASE 22 02/07/2020  1152       Studies/Results: CT ABDOMEN PELVIS WO CONTRAST  Result Date: 02/07/2020 CLINICAL DATA:  GI bleed. Abdominal pain and cramping for 24 hours. Abnormal previous CT scan. EXAM: CT ABDOMEN AND PELVIS WITHOUT CONTRAST TECHNIQUE: Multidetector CT imaging of the abdomen and pelvis was performed following the standard protocol without IV contrast. COMPARISON:  Earlier CT scan, same date. FINDINGS: Lower chest: Streaky bibasilar atelectasis noted. No pleural effusions or pulmonary infiltrates. Aortic and coronary artery calcifications are noted. Hepatobiliary: No hepatic lesions are identified without contrast. Stable high attenuation perihepatic fluid. The gallbladder is normal. No common bile duct dilatation. Pancreas: No mass, inflammation or ductal dilatation. Spleen: Normal size.  Stable high attenuation perihepatic fluid. Adrenals/Urinary Tract: The adrenal glands are normal and stable. Small renal calculi but no obstructing ureteral calculi or bladder calculi. No worrisome renal or bladder lesions. Small bladder diverticulum noted. Stomach/Bowel: The stomach, duodenum, small bowel and colon are unremarkable. No acute inflammatory changes, mass lesions or obstructive findings. No leaking oral contrast is identified. Significant sigmoid colon  diverticulosis but no findings for acute diverticulitis. Vascular/Lymphatic: The aorta is normal in caliber. Mild tortuosity and scattered atherosclerotic calcifications. No periaortic fluid collections. No mesenteric or retroperitoneal mass or adenopathy. Reproductive: The prostate gland and seminal vesicles are unremarkable. Other: Persistent but stable high attenuation fluid throughout the abdomen pelvis worrisome for hemoperitoneum. 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 Could not exclude the possibility that this is leaking. Recommend vascular surgery consultation. Musculoskeletal: No significant  bony findings. IMPRESSION: 1. Persistent but stable high attenuation fluid throughout the abdomen pelvis worrisome for hemoperitoneum. 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 Could not exclude the possibility that this is leaking. Recommend vascular surgery consultation. 2. Sigmoid colon diverticulosis without findings for acute diverticulitis. 3. Small renal calculi but no obstructing ureteral calculi or bladder calculi. Electronically Signed   By: Rudie Meyer M.D.   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 The antibiotic was administered within 1 hour of the procedure ANESTHESIA/SEDATION: Moderate (conscious) sedation was employed during this procedure. A total of Versed 9.0 mg and Fentanyl 300 mcg was administered intravenously. Moderate Sedation Time: 142 minutes. The patient's level of consciousness and vital signs were monitored continuously by radiology nursing throughout the procedure under my direct supervision. CONTRAST:  Sixty-five cc FLUOROSCOPY TIME:  Fluoroscopy Time: 26 minutes 30 seconds (1859 mGy). COMPLICATIONS: None PROCEDURE: Informed consent was obtained from the patient following explanation of the procedure, risks, benefits and alternatives. The patient understands, agrees and consents for the procedure. All questions were addressed. A time out was performed prior to the initiation of the procedure. Maximal barrier sterile technique utilized including caps, mask, sterile gowns, sterile gloves, large sterile drape, hand hygiene, and  Betadine prep. Ultrasound survey of the right inguinal region was performed with images stored and sent to PACs, confirming patency of the vessel. A micropuncture needle was used access the right common femoral artery under ultrasound. With excellent arterial blood flow returned, and an .018 micro wire was passed through the needle, observed enter the abdominal aorta under fluoroscopy. The needle was removed, and a micropuncture sheath was placed over the wire. The inner dilator and wire were removed, and an 035 Bentson wire was advanced under fluoroscopy into the abdominal aorta. The sheath was removed and a standard 5 [place] vascular sheath was placed. The dilator was removed and the sheath was flushed. Cobra catheter was advanced on the Bentson wire to the abdominal aorta and used to select the celiac artery origin. CO2 angiogram was performed. Glidewire was used to navigate the [place] catheter into the mid segment of the splenic artery. Wire was removed and angiogram was performed. Initial strategy was to enter the saccular aneurysm for coil embolization. A microcatheter system was advanced through the base catheter, using an STC microcatheter and a 14 fathom wire. We select the largest microcoils available to our service, which were not large enough to remain within the sac of the aneurysm. The micro coils prolapsed into the native vessel. Our second strategy was then to deliver a covered stent given the diameter of the vessel, to cover  the neck of the aneurysm. We elected to use a 5 mm by 5 cm covered Gore viabahn. Rose in wire was passed through the base catheter and exchange was made for a 35 cm bright tip 6 French sheath. 65 cm Kumpe the catheter was then advanced through the sheath into the splenic artery. Through the Kumpe the catheter a microcatheter system was advanced into the splenic artery beyond the aneurysm neck. Once we confirmed location with a small angiogram, an 014 transcend wire was placed. On  the first attempted delivery of the covered stent, there was ultimately prolapse of the system into the abdominal aorta. Stent was removed, microwire was removed, and exchange was made for a 6 French Brite tip 55 cm sheath. We then again selected the celiac artery with cobra catheter, with serial selection of the splenic artery and then the splenic artery beyond the aneurysm neck with a microcatheter system. The 65 cm Kumpe the catheter gained purchase within the mid segment of the splenic artery, and the longer, 55 cm Brite tip sheath was advanced into the proximal third of the splenic artery, although we could not advance around the tortuous segment. Again we placed an 0114 transcend wire beyond the neck of the aneurysm. At this point we attempted delivery of the covered stent. The covered stent would not advanced beyond the tortuosity of the mid segment of the splenic artery. The stent was removed and microcatheter was advanced on the transcend wire. We then exchanged for a 014 BMW wire. Another attempt was made to pass the stent. The stent would not pass around the tortuous segment. We then elected to coil embolize the splenic artery in the branches beyond the aneurysm neck and proximal to the aneurysm neck, essentially proximal splenic artery embolization. Once the stent was removed from the system, a penumbra lantern microcatheter was advanced on the BMW wire and the BMW wire was removed. 014 fathom wire was used then to select the downstream artery from the aneurysm neck. A 4 mm penumbra occlusive device was placed. Catheter was withdrawn. We then used the dome of the aneurysm to deflect into the second downstream artery. A 4 mm penumbra occlusive device was placed. Catheter was withdrawn into the more proximal splenic artery. A 6 mm penumbra occlusive device was placed. Additional Ruby coils placed on the proximal aspect of the POD. Final angiogram was performed confirming no flow through the segment. All  catheters wires were removed and Exoseal was placed for hemostasis. Patient tolerated procedure well and remained hemodynamically stable throughout. No complications were encountered and no significant blood loss. 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 ExoSeal deployed for hemostasis. Signed, Yvone Neu. Reyne Dumas, RPVI [facility] Radiology Electronically Signed   By: Gilmer Mor D.O.   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 The antibiotic was administered within 1 hour of the procedure ANESTHESIA/SEDATION: Moderate (conscious) sedation was employed during this procedure. A total of Versed 9.0 mg and Fentanyl 300 mcg was administered intravenously. Moderate Sedation Time: 142 minutes. The patient's  level of consciousness and vital signs were monitored continuously by radiology nursing throughout the procedure under my direct supervision. CONTRAST:  Sixty-five cc FLUOROSCOPY TIME:  Fluoroscopy Time: 26 minutes 30 seconds (1859 mGy). COMPLICATIONS: None PROCEDURE: Informed consent was obtained from the patient following explanation of the procedure, risks, benefits and alternatives. The patient understands, agrees and consents for the procedure. All questions were addressed. A time out was performed prior to the initiation of the procedure. Maximal barrier sterile technique utilized including caps,  mask, sterile gowns, sterile gloves, large sterile drape, hand hygiene, and Betadine prep. Ultrasound survey of the right inguinal region was performed with images stored and sent to PACs, confirming patency of the vessel. A micropuncture needle was used access the right common femoral artery under ultrasound. With excellent arterial blood flow returned, and an .018 micro wire was passed through the needle, observed enter the abdominal aorta under fluoroscopy. The needle was removed, and a micropuncture sheath was placed over the wire. The inner dilator and wire were removed, and an 035 Bentson wire was advanced under fluoroscopy into the abdominal aorta. The sheath was removed and a standard 5 [place] vascular sheath was placed. The dilator was removed and the sheath was flushed. Cobra catheter was advanced on the Bentson wire to the abdominal aorta and used to select the celiac artery origin. CO2 angiogram was performed. Glidewire was used to navigate the [place] catheter into the mid segment of the splenic artery. Wire was removed and angiogram was performed. Initial strategy was to enter the saccular aneurysm for coil embolization. A microcatheter system was advanced through the base catheter, using an STC microcatheter and a 14 fathom wire. We select the largest microcoils available to our service, which were not large enough to remain within the sac of the aneurysm. The micro coils prolapsed into the native vessel. Our second strategy was then to deliver a covered stent given the diameter of the vessel, to cover the neck of the aneurysm. We elected to use a 5 mm by 5 cm covered Gore viabahn. Rose in wire was passed through the base catheter and exchange was made for a 35 cm bright tip 6 French sheath. 65 cm Kumpe the catheter was then advanced through the sheath into the splenic artery. Through the Kumpe the catheter a microcatheter system was advanced into the splenic artery beyond the aneurysm neck. Once we  confirmed location with a small angiogram, an 014 transcend wire was placed. On the first attempted delivery of the covered stent, there was ultimately prolapse of the system into the abdominal aorta. Stent was removed, microwire was removed, and exchange was made for a 6 French Brite tip 55 cm sheath. We then again selected the celiac artery with cobra catheter, with serial selection of the splenic artery and then the splenic artery beyond the aneurysm neck with a microcatheter system. The 65 cm Kumpe the catheter gained purchase within the mid segment of the splenic artery, and the longer, 55 cm Brite tip sheath was advanced into the proximal third of the splenic artery, although we could not advance around the tortuous segment. Again we placed an 0114 transcend wire beyond the neck of the aneurysm. At this point we attempted delivery of the covered stent. The covered stent would not advanced beyond the tortuosity of the mid segment of the splenic artery. The stent was removed and microcatheter was advanced on the transcend wire. We then exchanged for a 014 BMW wire. Another attempt was made to  pass the stent. The stent would not pass around the tortuous segment. We then elected to coil embolize the splenic artery in the branches beyond the aneurysm neck and proximal to the aneurysm neck, essentially proximal splenic artery embolization. Once the stent was removed from the system, a penumbra lantern microcatheter was advanced on the BMW wire and the BMW wire was removed. 014 fathom wire was used then to select the downstream artery from the aneurysm neck. A 4 mm penumbra occlusive device was placed. Catheter was withdrawn. We then used the dome of the aneurysm to deflect into the second downstream artery. A 4 mm penumbra occlusive device was placed. Catheter was withdrawn into the more proximal splenic artery. A 6 mm penumbra occlusive device was placed. Additional Ruby coils placed on the proximal aspect of the  POD. Final angiogram was performed confirming no flow through the segment. All catheters wires were removed and Exoseal was placed for hemostasis. Patient tolerated procedure well and remained hemodynamically stable throughout. No complications were encountered and no significant blood loss. 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 ExoSeal deployed for hemostasis. Signed, Yvone Neu. Reyne Dumas, RPVI [facility] Radiology Electronically Signed   By: Gilmer Mor D.O.   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 The antibiotic was administered within 1 hour of the procedure ANESTHESIA/SEDATION: Moderate (conscious) sedation was employed during this procedure. A total of Versed 9.0 mg and Fentanyl 300 mcg was administered intravenously. Moderate Sedation Time: 142 minutes. The patient's level of consciousness and vital signs were monitored continuously by radiology nursing throughout the procedure under my direct supervision. CONTRAST:  Sixty-five cc FLUOROSCOPY TIME:  Fluoroscopy Time: 26 minutes 30 seconds (1859 mGy). COMPLICATIONS: None PROCEDURE: Informed consent was obtained from the patient following explanation of the procedure, risks, benefits and alternatives. The patient understands, agrees and consents for the procedure. All questions were addressed. A time out was performed prior to the initiation  of the procedure. Maximal barrier sterile technique utilized including caps, mask, sterile gowns, sterile gloves, large sterile drape, hand hygiene, and Betadine prep. Ultrasound survey of the right inguinal region was performed with images stored and sent to PACs, confirming patency of the vessel. A micropuncture needle was used access the right common femoral artery under ultrasound. With excellent arterial blood flow returned, and an .018 micro wire was passed through the needle, observed enter the abdominal aorta under fluoroscopy. The needle was removed, and a micropuncture sheath was placed over the wire. The inner dilator and wire were removed, and an 035 Bentson wire was advanced under fluoroscopy into the abdominal aorta. The sheath was removed and a standard 5 [place] vascular sheath was placed. The dilator was removed and the sheath was flushed. Cobra catheter was advanced on the Bentson wire to the abdominal aorta and used to select the celiac artery origin. CO2 angiogram was performed. Glidewire was used to navigate the [place] catheter into the mid segment of the splenic artery. Wire was removed and angiogram was performed. Initial strategy was to enter the saccular  aneurysm for coil embolization. A microcatheter system was advanced through the base catheter, using an STC microcatheter and a 14 fathom wire. We select the largest microcoils available to our service, which were not large enough to remain within the sac of the aneurysm. The micro coils prolapsed into the native vessel. Our second strategy was then to deliver a covered stent given the diameter of the vessel, to cover the neck of the aneurysm. We elected to use a 5 mm by 5 cm covered Gore viabahn. Rose in wire was passed through the base catheter and exchange was made for a 35 cm bright tip 6 French sheath. 65 cm Kumpe the catheter was then advanced through the sheath into the splenic artery. Through the Kumpe the catheter a microcatheter  system was advanced into the splenic artery beyond the aneurysm neck. Once we confirmed location with a small angiogram, an 014 transcend wire was placed. On the first attempted delivery of the covered stent, there was ultimately prolapse of the system into the abdominal aorta. Stent was removed, microwire was removed, and exchange was made for a 6 French Brite tip 55 cm sheath. We then again selected the celiac artery with cobra catheter, with serial selection of the splenic artery and then the splenic artery beyond the aneurysm neck with a microcatheter system. The 65 cm Kumpe the catheter gained purchase within the mid segment of the splenic artery, and the longer, 55 cm Brite tip sheath was advanced into the proximal third of the splenic artery, although we could not advance around the tortuous segment. Again we placed an 0114 transcend wire beyond the neck of the aneurysm. At this point we attempted delivery of the covered stent. The covered stent would not advanced beyond the tortuosity of the mid segment of the splenic artery. The stent was removed and microcatheter was advanced on the transcend wire. We then exchanged for a 014 BMW wire. Another attempt was made to pass the stent. The stent would not pass around the tortuous segment. We then elected to coil embolize the splenic artery in the branches beyond the aneurysm neck and proximal to the aneurysm neck, essentially proximal splenic artery embolization. Once the stent was removed from the system, a penumbra lantern microcatheter was advanced on the BMW wire and the BMW wire was removed. 014 fathom wire was used then to select the downstream artery from the aneurysm neck. A 4 mm penumbra occlusive device was placed. Catheter was withdrawn. We then used the dome of the aneurysm to deflect into the second downstream artery. A 4 mm penumbra occlusive device was placed. Catheter was withdrawn into the more proximal splenic artery. A 6 mm penumbra occlusive  device was placed. Additional Ruby coils placed on the proximal aspect of the POD. Final angiogram was performed confirming no flow through the segment. All catheters wires were removed and Exoseal was placed for hemostasis. Patient tolerated procedure well and remained hemodynamically stable throughout. No complications were encountered and no significant blood loss. 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 ExoSeal deployed for hemostasis. Signed, Yvone Neu. Reyne Dumas, RPVI [facility] Radiology Electronically Signed   By: Gilmer Mor D.O.   On: 02/09/2020 09:20   US RENAL  Result Date: 02/08/2020 CLINICAL DATA:  68 year old male with acute renal insufficiency. Suspected hemoperitoneum on recent CT Abdomen and Pelvis. EXAM: RENAL /  URINARY TRACT ULTRASOUND COMPLETE COMPARISON:  CT Abdomen and Pelvis 02/07/2020. FINDINGS: Right Kidney: Renal measurements: 11.9 x 5.4 x 5.9 cm = volume: 198 mL. Echogenicity at the upper limits of normal (image 4). No mass or hydronephrosis visualized. Left Kidney: Renal measurements: 13.2 x 6.3 x 6.3 cm = volume: 273 mL. Echogenicity at the upper limits of normal (image 15). No mass or hydronephrosis visualized. Bladder: Appears normal for degree of bladder distention. Other: Free fluid in the both upper quadrants although visible fluid appears fairly simple (images 4 and 13). IMPRESSION: 1. No acute renal finding. Borderline renal cortical echogenicity raising the possibility of some chronic medical renal disease. 2. Small volume of free fluid redemonstrated in both upper quadrants. Electronically Signed   By: Odessa Fleming M.D.   On: 02/08/2020 07:57   IR US Guide Vasc Access Right  Result Date: 02/09/2020 INDICATION: 70 year old male with ruptured visceral artery aneurysm, splenic  artery, presents for angiogram and possible embolization EXAM: ULTRASOUND-GUIDED ACCESS RIGHT COMMON FEMORAL ARTERY MESENTERIC ANGIOGRAM TREATMENT OF RUPTURED VISCERAL ARTERY ANEURYSM FROM THE SPLENIC ARTERY WITH PROXIMAL COIL EMBOLIZATION OF THE SPLENIC ARTERY EXOSEAL MEDICATIONS: None. The antibiotic was administered within 1 hour of the procedure ANESTHESIA/SEDATION: Moderate (conscious) sedation was employed during this procedure. A total of Versed 9.0 mg and Fentanyl 300 mcg was administered intravenously. Moderate Sedation Time: 142 minutes. The patient's level of consciousness and vital signs were monitored continuously by radiology nursing throughout the procedure under my direct supervision. CONTRAST:  Sixty-five cc FLUOROSCOPY TIME:  Fluoroscopy Time: 26 minutes 30 seconds (1859 mGy). COMPLICATIONS: None PROCEDURE: Informed consent was obtained from the patient following explanation of the procedure, risks, benefits and alternatives. The patient understands, agrees and consents for the procedure. All questions were addressed. A time out was performed prior to the initiation of the procedure. Maximal barrier sterile technique utilized including caps, mask, sterile gowns, sterile gloves, large sterile drape, hand hygiene, and Betadine prep. Ultrasound survey of the right inguinal region was performed with images stored and sent to PACs, confirming patency of the vessel. A micropuncture needle was used access the right common femoral artery under ultrasound. With excellent arterial blood flow returned, and an .018 micro wire was passed through the needle, observed enter the abdominal aorta under fluoroscopy. The needle was removed, and a micropuncture sheath was placed over the wire. The inner dilator and wire were removed, and an 035 Bentson wire was advanced under fluoroscopy into the abdominal aorta. The sheath was removed and a standard 5 [place] vascular sheath was placed. The dilator was removed and the  sheath was flushed. Cobra catheter was advanced on the Bentson wire to the abdominal aorta and used to select the celiac artery origin. CO2 angiogram was performed. Glidewire was used to navigate the [place] catheter into the mid segment of the splenic artery. Wire was removed and angiogram was performed. Initial strategy was to enter the saccular aneurysm for coil embolization. A microcatheter system was advanced through the base catheter, using an STC microcatheter and a 14 fathom wire. We select the largest microcoils available to our service, which were not large enough to remain within the sac of the aneurysm. The micro coils prolapsed into the native vessel. Our second strategy was then to deliver a covered stent given the diameter of the vessel, to cover the neck of the aneurysm. We elected to use a 5 mm by 5 cm covered Gore viabahn. Rose in wire was passed through the base catheter and exchange  was made for a 35 cm bright tip 6 French sheath. 65 cm Kumpe the catheter was then advanced through the sheath into the splenic artery. Through the Kumpe the catheter a microcatheter system was advanced into the splenic artery beyond the aneurysm neck. Once we confirmed location with a small angiogram, an 014 transcend wire was placed. On the first attempted delivery of the covered stent, there was ultimately prolapse of the system into the abdominal aorta. Stent was removed, microwire was removed, and exchange was made for a 6 French Brite tip 55 cm sheath. We then again selected the celiac artery with cobra catheter, with serial selection of the splenic artery and then the splenic artery beyond the aneurysm neck with a microcatheter system. The 65 cm Kumpe the catheter gained purchase within the mid segment of the splenic artery, and the longer, 55 cm Brite tip sheath was advanced into the proximal third of the splenic artery, although we could not advance around the tortuous segment. Again we placed an 0114 transcend  wire beyond the neck of the aneurysm. At this point we attempted delivery of the covered stent. The covered stent would not advanced beyond the tortuosity of the mid segment of the splenic artery. The stent was removed and microcatheter was advanced on the transcend wire. We then exchanged for a 014 BMW wire. Another attempt was made to pass the stent. The stent would not pass around the tortuous segment. We then elected to coil embolize the splenic artery in the branches beyond the aneurysm neck and proximal to the aneurysm neck, essentially proximal splenic artery embolization. Once the stent was removed from the system, a penumbra lantern microcatheter was advanced on the BMW wire and the BMW wire was removed. 014 fathom wire was used then to select the downstream artery from the aneurysm neck. A 4 mm penumbra occlusive device was placed. Catheter was withdrawn. We then used the dome of the aneurysm to deflect into the second downstream artery. A 4 mm penumbra occlusive device was placed. Catheter was withdrawn into the more proximal splenic artery. A 6 mm penumbra occlusive device was placed. Additional Ruby coils placed on the proximal aspect of the POD. Final angiogram was performed confirming no flow through the segment. All catheters wires were removed and Exoseal was placed for hemostasis. Patient tolerated procedure well and remained hemodynamically stable throughout. No complications were encountered and no significant blood loss. 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 ExoSeal deployed for hemostasis. Signed, Yvone Neu. Reyne Dumas, RPVI [facility] Radiology Electronically Signed   By: Gilmer Mor D.O.   On: 02/09/2020 09:20   CT Renal Stone Study  Result Date: 02/07/2020 CLINICAL DATA:  Flank pain.   Abdominal pain. EXAM: CT ABDOMEN AND PELVIS WITHOUT CONTRAST TECHNIQUE: Multidetector CT imaging of the abdomen and pelvis was performed following the standard protocol without IV contrast. COMPARISON:  None. FINDINGS: Lower chest: Hazy airspace opacities in bilateral lower lobes. Additional areas of dependent atelectasis. Hepatobiliary: No focal liver abnormality is seen. No gallstones, gallbladder wall thickening, or biliary dilatation. Pancreas: Unremarkable. No pancreatic ductal dilatation or surrounding inflammatory changes. Spleen: Normal in size without focal abnormality. Adrenals/Urinary Tract: Adrenal glands are unremarkable. Kidneys are normal, without renal calculi, focal lesion, or hydronephrosis. Small right peritrigonal bladder diverticulum. Stomach/Bowel: Stomach is within normal limits. The appendix is not seen. No  evidence of bowel wall thickening, distention, or inflammatory changes. Diffuse left colonic diverticulosis without evidence of acute diverticulitis. Vascular/Lymphatic: Aortic atherosclerosis. No enlarged abdominal or pelvic lymph nodes. Reproductive: Mild enlargement of the prostate gland with coarse calcifications. Other: Moderate amount of high density fluid is seen within the abdomen and pelvis. There is a rim calcified 2.1 cm structure adjacent to the tail of the pancreas with surrounding fat stranding, which may represent calcified aneurysm or pseudoaneurysm. Musculoskeletal: No acute or significant osseous findings. IMPRESSION: 1. Moderate amount of high density fluid within the abdomen and pelvis, concerning for hemoperitoneum. Alternatively this may represent a ruptured viscus, however the lack of free gas argues against that. A third consideration would be a radio occult acute pancreatitis. CT angiography, conventional angiography and/or surgical consult is recommended. 2. 2.1 cm rim calcified structure adjacent to the tail of the pancreas with surrounding fat stranding, which  may represent calcified vascular aneurysm or pseudoaneurysm. 3. Diffuse left colonic diverticulosis without evidence of acute diverticulitis. 4. Small right peritrigonal bladder diverticulum. 5. Mild enlargement of the prostate gland with coarse calcifications. Please correlate to serum PSA values. 6. Hazy airspace opacities in the dependent portions of the bilateral lower lobes may represent atelectasis or early airspace consolidation. 7. Aortic atherosclerosis. These results were called by telephone at the time of interpretation on 02/07/2020 at 2:07 pm to [facility] , who verbally acknowledged these results. Aortic Atherosclerosis (ICD10-I70.0). Electronically Signed   By: Ted Mcalpineobrinka  Dimitrova M.D.   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 The antibiotic was administered within 1 hour of the procedure ANESTHESIA/SEDATION: Moderate (conscious) sedation was employed during this procedure. A total of Versed 9.0 mg and Fentanyl 300 mcg was administered intravenously. Moderate Sedation Time: 142 minutes. The patient's level of consciousness and vital signs were monitored continuously by radiology nursing throughout the procedure under my direct supervision. CONTRAST:  Sixty-five cc FLUOROSCOPY TIME:  Fluoroscopy Time: 26 minutes 30 seconds (1859 mGy). COMPLICATIONS: None PROCEDURE: Informed consent was obtained from the patient following explanation of the procedure, risks, benefits and alternatives. The patient understands, agrees and consents for the procedure. All questions were  addressed. A time out was performed prior to the initiation of the procedure. Maximal barrier sterile technique utilized including caps, mask, sterile gowns, sterile gloves, large sterile drape, hand hygiene, and Betadine prep. Ultrasound survey of the right inguinal region was performed with images stored and sent to PACs, confirming patency of the vessel. A micropuncture needle was used access the right common femoral artery under ultrasound. With excellent arterial blood flow returned, and an .018 micro wire was passed through the needle, observed enter the abdominal aorta under fluoroscopy. The needle was removed, and a micropuncture sheath was placed over the wire. The inner dilator and wire were removed, and an 035 Bentson wire was advanced under fluoroscopy into the abdominal aorta. The sheath was removed and a standard 5 [place] vascular sheath was placed. The dilator was removed and the sheath was flushed. Cobra catheter was advanced on the Bentson wire to the abdominal aorta and used to select the celiac artery origin. CO2 angiogram was performed. Glidewire was  used to navigate the [place] catheter into the mid segment of the splenic artery. Wire was removed and angiogram was performed. Initial strategy was to enter the saccular aneurysm for coil embolization. A microcatheter system was advanced through the base catheter, using an STC microcatheter and a 14 fathom wire. We select the largest microcoils available to our service, which were not large enough to remain within the sac of the aneurysm. The micro coils prolapsed into the native vessel. Our second strategy was then to deliver a covered stent given the diameter of the vessel, to cover the neck of the aneurysm. We elected to use a 5 mm by 5 cm covered Gore viabahn. Rose in wire was passed through the base catheter and exchange was made for a 35 cm bright tip 6 French sheath. 65 cm Kumpe the catheter was then advanced through the sheath into the splenic  artery. Through the Kumpe the catheter a microcatheter system was advanced into the splenic artery beyond the aneurysm neck. Once we confirmed location with a small angiogram, an 014 transcend wire was placed. On the first attempted delivery of the covered stent, there was ultimately prolapse of the system into the abdominal aorta. Stent was removed, microwire was removed, and exchange was made for a 6 French Brite tip 55 cm sheath. We then again selected the celiac artery with cobra catheter, with serial selection of the splenic artery and then the splenic artery beyond the aneurysm neck with a microcatheter system. The 65 cm Kumpe the catheter gained purchase within the mid segment of the splenic artery, and the longer, 55 cm Brite tip sheath was advanced into the proximal third of the splenic artery, although we could not advance around the tortuous segment. Again we placed an 0114 transcend wire beyond the neck of the aneurysm. At this point we attempted delivery of the covered stent. The covered stent would not advanced beyond the tortuosity of the mid segment of the splenic artery. The stent was removed and microcatheter was advanced on the transcend wire. We then exchanged for a 014 BMW wire. Another attempt was made to pass the stent. The stent would not pass around the tortuous segment. We then elected to coil embolize the splenic artery in the branches beyond the aneurysm neck and proximal to the aneurysm neck, essentially proximal splenic artery embolization. Once the stent was removed from the system, a penumbra lantern microcatheter was advanced on the BMW wire and the BMW wire was removed. 014 fathom wire was used then to select the downstream artery from the aneurysm neck. A 4 mm penumbra occlusive device was placed. Catheter was withdrawn. We then used the dome of the aneurysm to deflect into the second downstream artery. A 4 mm penumbra occlusive device was placed. Catheter was withdrawn into the  more proximal splenic artery. A 6 mm penumbra occlusive device was placed. Additional Ruby coils placed on the proximal aspect of the POD. Final angiogram was performed confirming no flow through the segment. All catheters wires were removed and Exoseal was placed for hemostasis. Patient tolerated procedure well and remained hemodynamically stable throughout. No complications were encountered and no significant blood loss. 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 ExoSeal deployed for hemostasis. Signed, Yvone Neu. Reyne Dumas, RPVI [facility] Radiology Electronically Signed   By: Gilmer Mor D.O.   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 The antibiotic was administered within 1 hour of the procedure ANESTHESIA/SEDATION: Moderate (conscious) sedation was employed during this procedure. A total of Versed 9.0 mg and Fentanyl 300 mcg was administered intravenously. Moderate Sedation Time: 142 minutes. The patient's level of consciousness and vital signs were monitored continuously by radiology nursing throughout the procedure under my direct supervision. CONTRAST:  Sixty-five cc FLUOROSCOPY TIME:  Fluoroscopy Time: 26 minutes 30 seconds (1859 mGy). COMPLICATIONS: None PROCEDURE: Informed consent was obtained from the patient following explanation of the procedure, risks, benefits and  alternatives. The patient understands, agrees and consents for the procedure. All questions were addressed. A time out was performed prior to the initiation of the procedure. Maximal barrier sterile technique utilized including caps, mask, sterile gowns, sterile gloves, large sterile drape, hand hygiene, and Betadine prep. Ultrasound survey of the right inguinal region was performed with images stored and sent to PACs, confirming patency of the vessel. A micropuncture needle was used access the right common femoral artery under ultrasound. With excellent arterial blood flow returned, and an .018 micro wire was passed through the needle, observed enter the abdominal aorta under fluoroscopy. The needle was removed, and a micropuncture sheath was placed over the wire. The inner dilator and wire were removed, and an 035 Bentson wire was advanced under fluoroscopy into the abdominal aorta. The sheath was removed and a standard 5 [place] vascular sheath was placed. The dilator was removed and the sheath was flushed. Cobra catheter was advanced on the Bentson wire to the abdominal aorta and used to select the celiac artery origin. CO2 angiogram was performed. Glidewire was used to navigate the [place] catheter into the mid segment of the splenic artery. Wire was removed and angiogram was performed. Initial strategy was to enter the saccular aneurysm for coil embolization. A microcatheter system was advanced through the base catheter, using an STC microcatheter and a 14 fathom wire. We select the largest microcoils available to our service, which were not large enough to remain within the sac of the aneurysm. The micro coils prolapsed into the native vessel. Our second strategy was then to deliver a covered stent given the diameter of the vessel, to cover the neck of the aneurysm. We elected to use a 5 mm by 5 cm covered Gore viabahn. Rose in wire was passed through the base catheter and exchange was made for a 35 cm bright  tip 6 French sheath. 65 cm Kumpe the catheter was then advanced through the sheath into the splenic artery. Through the Kumpe the catheter a microcatheter system was advanced into the splenic artery beyond the aneurysm neck. Once we confirmed location with a small angiogram, an 014 transcend wire was placed. On the first attempted delivery of the covered stent, there was ultimately prolapse of the system into the abdominal aorta. Stent was removed, microwire was removed, and exchange was made for a 6 French Brite tip 55 cm sheath. We then again selected the celiac artery with cobra catheter, with serial selection of the splenic artery and then the splenic artery beyond the aneurysm neck with a microcatheter system. The 65 cm Kumpe the catheter gained  purchase within the mid segment of the splenic artery, and the longer, 55 cm Brite tip sheath was advanced into the proximal third of the splenic artery, although we could not advance around the tortuous segment. Again we placed an 0114 transcend wire beyond the neck of the aneurysm. At this point we attempted delivery of the covered stent. The covered stent would not advanced beyond the tortuosity of the mid segment of the splenic artery. The stent was removed and microcatheter was advanced on the transcend wire. We then exchanged for a 014 BMW wire. Another attempt was made to pass the stent. The stent would not pass around the tortuous segment. We then elected to coil embolize the splenic artery in the branches beyond the aneurysm neck and proximal to the aneurysm neck, essentially proximal splenic artery embolization. Once the stent was removed from the system, a penumbra lantern microcatheter was advanced on the BMW wire and the BMW wire was removed. 014 fathom wire was used then to select the downstream artery from the aneurysm neck. A 4 mm penumbra occlusive device was placed. Catheter was withdrawn. We then used the dome of the aneurysm to deflect into the  second downstream artery. A 4 mm penumbra occlusive device was placed. Catheter was withdrawn into the more proximal splenic artery. A 6 mm penumbra occlusive device was placed. Additional Ruby coils placed on the proximal aspect of the POD. Final angiogram was performed confirming no flow through the segment. All catheters wires were removed and Exoseal was placed for hemostasis. Patient tolerated procedure well and remained hemodynamically stable throughout. No complications were encountered and no significant blood loss. 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 ExoSeal deployed for hemostasis. Signed, Yvone Neu. Reyne Dumas, RPVI [facility] Radiology [facility] Radiology Electronically Signed   By: Gilmer Mor D.O.   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

## 2020-02-09 NOTE — Progress Notes (Signed)
Referring Physician(s): Dr. 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 Loreta Ave.   Subjective: Patient in bed, awake/alert. No obvious discomfort/distress observed. He is slowly increasing his food intake and he's ambulated to the bathroom several times. He has moderate abdominal pain but he states it's improving.   Allergies: Amoxicillin-pot clavulanate  Medications: Prior to Admission medications   Medication Sig Start Date End Date Taking? Authorizing Provider  pravastatin (PRAVACHOL) 40 MG tablet Take 1 tablet by mouth daily. 01/19/20  Yes [provider]     Vital Signs: BP (!) 141/90   Pulse 75   Temp 98.7 F (37.1 C) (Oral)   Resp 14   Ht 6' (1.829 m)   Wt 173 lb 8 oz (78.7 kg)   SpO2 92%   BMI 23.53 kg/m   Physical Exam Constitutional:      General: He is not in acute distress. Cardiovascular:     Rate and Rhythm: Normal rate and regular rhythm.     Comments: Right vascular site is clean and dry. No evidence of hematoma. Minimal tenderness to palpation.  Pulmonary:     Effort: Pulmonary effort is normal.  Abdominal:     Tenderness: There is abdominal tenderness.  Skin:    General: Skin is warm and dry.  Neurological:     Mental Status: He is alert and oriented to person, place, and time.     Imaging: CT ABDOMEN PELVIS WO CONTRAST  Result Date: 02/07/2020 CLINICAL DATA:  GI bleed. Abdominal pain and cramping for 24 hours. Abnormal previous CT scan. EXAM: CT ABDOMEN AND PELVIS WITHOUT CONTRAST TECHNIQUE: Multidetector CT imaging of the abdomen and pelvis was performed following the standard protocol without IV contrast. COMPARISON:  Earlier CT scan, same date. FINDINGS: Lower chest: Streaky bibasilar atelectasis noted. No pleural effusions or pulmonary  infiltrates. Aortic and coronary artery calcifications are noted. Hepatobiliary: No hepatic lesions are identified without contrast. Stable high attenuation perihepatic fluid. The gallbladder is normal. No common bile duct dilatation. Pancreas: No mass, inflammation or ductal dilatation. Spleen: Normal size.  Stable high attenuation perihepatic fluid. Adrenals/Urinary Tract: The adrenal glands are normal and stable. Small renal calculi but no obstructing ureteral calculi or bladder calculi. No worrisome renal or bladder lesions. Small bladder diverticulum noted. Stomach/Bowel: The stomach, duodenum, small bowel and colon are unremarkable. No acute inflammatory changes, mass lesions or obstructive findings. No leaking oral contrast is identified. Significant sigmoid colon diverticulosis but no findings for acute diverticulitis. Vascular/Lymphatic: The aorta is normal in caliber. Mild tortuosity and scattered atherosclerotic calcifications. No periaortic fluid collections. No mesenteric or retroperitoneal mass or adenopathy. Reproductive: The prostate gland and seminal vesicles are unremarkable. Other: Persistent but stable high attenuation fluid throughout the abdomen pelvis worrisome for hemoperitoneum. 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 Could not exclude the possibility that this is leaking. Recommend vascular surgery consultation. Musculoskeletal: No significant bony findings. IMPRESSION: 1. Persistent but stable high attenuation fluid throughout the abdomen pelvis worrisome for hemoperitoneum. 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 Could not exclude the possibility that this is leaking. Recommend vascular surgery consultation. 2. Sigmoid colon diverticulosis without findings for acute diverticulitis. 3. Small renal calculi but no obstructing ureteral calculi or bladder calculi.  Electronically Signed   By: Rudie Meyer M.D.   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 The antibiotic was administered within 1 hour of the procedure ANESTHESIA/SEDATION: Moderate (conscious) sedation was employed during this procedure. A total of Versed 9.0 mg and Fentanyl 300 mcg was administered intravenously. Moderate Sedation Time: 142 minutes. The patient's level of consciousness and vital signs were monitored continuously by radiology nursing throughout the procedure under my direct supervision. CONTRAST:  Sixty-five cc FLUOROSCOPY TIME:  Fluoroscopy Time: 26 minutes 30 seconds (1859 mGy). COMPLICATIONS: None PROCEDURE: Informed consent was obtained from the patient following explanation of the procedure, risks, benefits and alternatives. The patient understands, agrees and consents for the procedure. All questions were addressed. A time out was performed prior to the initiation of the procedure. Maximal barrier sterile technique utilized including caps, mask, sterile gowns, sterile gloves, large sterile drape, hand hygiene, and Betadine prep. Ultrasound survey of the right inguinal region was performed with images stored and sent to PACs, confirming patency of the vessel. A micropuncture needle was used access the right common femoral artery under ultrasound. With excellent arterial blood flow returned, and an .018 micro wire was passed through the needle, observed enter the abdominal aorta under fluoroscopy. The needle was removed, and a micropuncture sheath was placed over the wire. The inner dilator  and wire were removed, and an 035 Bentson wire was advanced under fluoroscopy into the abdominal aorta. The sheath was removed and a standard 5 [place] vascular sheath was placed. The dilator was removed and the sheath was flushed. Cobra catheter was advanced on the Bentson wire to the abdominal aorta and used to select the celiac artery origin. CO2 angiogram was performed. Glidewire was used to navigate the [place] catheter into the mid segment of the splenic artery. Wire was removed and angiogram was performed. Initial strategy was to enter the saccular aneurysm for coil embolization. A microcatheter system was advanced through the base catheter, using an STC microcatheter and a 14 fathom wire. We select the largest microcoils available to our service, which were not large enough to remain within the sac of the aneurysm. The micro coils prolapsed into the native vessel. Our second strategy was then to deliver a covered stent given the diameter of the vessel, to cover the neck of the aneurysm. We elected to use a 5 mm by 5 cm covered Gore viabahn. Rose in wire was passed through the base catheter and exchange was made for a 35 cm bright tip 6 French sheath. 65 cm Kumpe the catheter was then advanced through the sheath into the splenic artery. Through the Kumpe the catheter a microcatheter system was advanced into the splenic artery beyond the aneurysm neck. Once we confirmed location with a small angiogram, an 014 transcend wire was placed. On the first attempted delivery of the covered stent, there was ultimately prolapse of the system into the abdominal aorta. Stent was removed, microwire was removed, and exchange was made for a 6 French Brite tip 55 cm sheath. We then again selected the celiac artery with cobra catheter, with serial selection of the splenic artery and then the splenic artery beyond the aneurysm neck with a microcatheter system.  The 65 cm Kumpe the catheter gained purchase within the mid segment of  the splenic artery, and the longer, 55 cm Brite tip sheath was advanced into the proximal third of the splenic artery, although we could not advance around the tortuous segment. Again we placed an 0114 transcend wire beyond the neck of the aneurysm. At this point we attempted delivery of the covered stent. The covered stent would not advanced beyond the tortuosity of the mid segment of the splenic artery. The stent was removed and microcatheter was advanced on the transcend wire. We then exchanged for a 014 BMW wire. Another attempt was made to pass the stent. The stent would not pass around the tortuous segment. We then elected to coil embolize the splenic artery in the branches beyond the aneurysm neck and proximal to the aneurysm neck, essentially proximal splenic artery embolization. Once the stent was removed from the system, a penumbra lantern microcatheter was advanced on the BMW wire and the BMW wire was removed. 014 fathom wire was used then to select the downstream artery from the aneurysm neck. A 4 mm penumbra occlusive device was placed. Catheter was withdrawn. We then used the dome of the aneurysm to deflect into the second downstream artery. A 4 mm penumbra occlusive device was placed. Catheter was withdrawn into the more proximal splenic artery. A 6 mm penumbra occlusive device was placed. Additional Ruby coils placed on the proximal aspect of the POD. Final angiogram was performed confirming no flow through the segment. All catheters wires were removed and Exoseal was placed for hemostasis. Patient tolerated procedure well and remained hemodynamically stable throughout. No complications were encountered and no significant blood loss. 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 ExoSeal deployed for hemostasis. Signed, Yvone Neu. Reyne Dumas, RPVI Vascular  and Interventional Radiology Specialists [facility] Radiology Electronically Signed   By: Gilmer Mor D.O.   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 The antibiotic was administered within 1 hour of the procedure ANESTHESIA/SEDATION: Moderate (conscious) sedation was employed during this procedure. A total of Versed 9.0 mg and Fentanyl 300 mcg was administered intravenously. Moderate Sedation Time: 142 minutes. The patient's level of consciousness and vital signs were monitored continuously by radiology nursing throughout the procedure under my direct supervision. CONTRAST:  Sixty-five cc FLUOROSCOPY TIME:  Fluoroscopy Time: 26 minutes 30 seconds (1859 mGy). COMPLICATIONS: None PROCEDURE: Informed consent was obtained from the patient following explanation of the procedure, risks, benefits and alternatives. The patient understands, agrees and consents for the procedure. All questions were addressed. A time out was performed prior to the initiation of the procedure. Maximal barrier sterile technique utilized including caps, mask, sterile gowns, sterile gloves, large sterile drape, hand hygiene, and Betadine prep. Ultrasound survey of the right inguinal region was performed with images stored and sent to PACs, confirming patency of the vessel. A micropuncture needle was used access the right common femoral artery under ultrasound. With excellent arterial blood flow returned, and an .018 micro wire was passed through the needle, observed enter the abdominal aorta under fluoroscopy. The  needle was  removed, and a micropuncture sheath was placed over the wire. The inner dilator and wire were removed, and an 035 Bentson wire was advanced under fluoroscopy into the abdominal aorta. The sheath was removed and a standard 5 [place] vascular sheath was placed. The dilator was removed and the sheath was flushed. Cobra catheter was advanced on the Bentson wire to the abdominal aorta and used to select the celiac artery origin. CO2 angiogram was performed. Glidewire was used to navigate the [place] catheter into the mid segment of the splenic artery. Wire was removed and angiogram was performed. Initial strategy was to enter the saccular aneurysm for coil embolization. A microcatheter system was advanced through the base catheter, using an STC microcatheter and a 14 fathom wire. We select the largest microcoils available to our service, which were not large enough to remain within the sac of the aneurysm. The micro coils prolapsed into the native vessel. Our second strategy was then to deliver a covered stent given the diameter of the vessel, to cover the neck of the aneurysm. We elected to use a 5 mm by 5 cm covered Gore viabahn. Rose in wire was passed through the base catheter and exchange was made for a 35 cm bright tip 6 French sheath. 65 cm Kumpe the catheter was then advanced through the sheath into the splenic artery. Through the Kumpe the catheter a microcatheter system was advanced into the splenic artery beyond the aneurysm neck. Once we confirmed location with a small angiogram, an 014 transcend wire was placed. On the first attempted delivery of the covered stent, there was ultimately prolapse of the system into the abdominal aorta. Stent was removed, microwire was removed, and exchange was made for a 6 French Brite tip 55 cm sheath. We then again selected the celiac artery with cobra catheter, with serial selection of the splenic artery and then the splenic artery beyond the aneurysm neck with a microcatheter  system. The 65 cm Kumpe the catheter gained purchase within the mid segment of the splenic artery, and the longer, 55 cm Brite tip sheath was advanced into the proximal third of the splenic artery, although we could not advance around the tortuous segment. Again we placed an 0114 transcend wire beyond the neck of the aneurysm. At this point we attempted delivery of the covered stent. The covered stent would not advanced beyond the tortuosity of the mid segment of the splenic artery. The stent was removed and microcatheter was advanced on the transcend wire. We then exchanged for a 014 BMW wire. Another attempt was made to pass the stent. The stent would not pass around the tortuous segment. We then elected to coil embolize the splenic artery in the branches beyond the aneurysm neck and proximal to the aneurysm neck, essentially proximal splenic artery embolization. Once the stent was removed from the system, a penumbra lantern microcatheter was advanced on the BMW wire and the BMW wire was removed. 014 fathom wire was used then to select the downstream artery from the aneurysm neck. A 4 mm penumbra occlusive device was placed. Catheter was withdrawn. We then used the dome of the aneurysm to deflect into the second downstream artery. A 4 mm penumbra occlusive device was placed. Catheter was withdrawn into the more proximal splenic artery. A 6 mm penumbra occlusive device was placed. Additional Ruby coils placed on the proximal aspect of the POD. Final angiogram was performed confirming no flow through the segment. All catheters wires were removed and  Exoseal was placed for hemostasis. Patient tolerated procedure well and remained hemodynamically stable throughout. No complications were encountered and no significant blood loss. IMPRESSION: Status post ultrasound guided access right common femoral artery for proximal coil embolization of the splenic artery for trapping and treatment of ruptured splenic artery  aneurysm. ExoSeal deployed for hemostasis. Signed, Yvone Neu. Reyne Dumas, RPVI Vascular and Interventional Radiology Specialists [facility] Radiology Electronically Signed   By: Gilmer Mor D.O.   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 The antibiotic was administered within 1 hour of the procedure ANESTHESIA/SEDATION: Moderate (conscious) sedation was employed during this procedure. A total of Versed 9.0 mg and Fentanyl 300 mcg was administered intravenously. Moderate Sedation Time: 142 minutes. The patient's level of consciousness and vital signs were monitored continuously by radiology nursing throughout the procedure under my direct supervision. CONTRAST:  Sixty-five cc FLUOROSCOPY TIME:  Fluoroscopy Time: 26 minutes 30 seconds (1859 mGy). COMPLICATIONS: None PROCEDURE: Informed consent was obtained from the patient following explanation of the procedure, risks, benefits and alternatives. The patient understands, agrees and consents for the procedure. All questions were addressed. A time out was performed prior to the initiation of the procedure. Maximal barrier sterile technique utilized including caps, mask, sterile gowns, sterile gloves, large sterile drape, hand hygiene, and Betadine prep. Ultrasound survey of the right inguinal region was performed with images stored and sent to PACs, confirming patency of the vessel. A micropuncture needle was used access the right common femoral artery under ultrasound. With excellent arterial blood flow returned, and an .018 micro wire was passed through  the needle, observed enter the abdominal aorta under fluoroscopy. The needle was removed, and a micropuncture sheath was placed over the wire. The inner dilator and wire were removed, and an 035 Bentson wire was advanced under fluoroscopy into the abdominal aorta. The sheath was removed and a standard 5 [place] vascular sheath was placed. The dilator was removed and the sheath was flushed. Cobra catheter was advanced on the Bentson wire to the abdominal aorta and used to select the celiac artery origin. CO2 angiogram was performed. Glidewire was used to navigate the [place] catheter into the mid segment of the splenic artery. Wire was removed and angiogram was performed. Initial strategy was to enter the saccular aneurysm for coil embolization. A microcatheter system was advanced through the base catheter, using an STC microcatheter and a 14 fathom wire. We select the largest microcoils available to our service, which were not large enough to remain within the sac of the aneurysm. The micro coils prolapsed into the native vessel. Our second strategy was then to deliver a covered stent given the diameter of the vessel, to cover the neck of the aneurysm. We elected to use a 5 mm by 5 cm covered Gore viabahn. Rose in wire was passed through the base catheter and exchange was made for a 35 cm bright tip 6 French sheath. 65 cm Kumpe the catheter was then advanced through the sheath into the splenic artery. Through the Kumpe the catheter a microcatheter system was advanced into the splenic artery beyond the aneurysm neck. Once we confirmed location with a small angiogram, an 014 transcend wire was  placed. On the first attempted delivery of the covered stent, there was ultimately prolapse of the system into the abdominal aorta. Stent was removed, microwire was removed, and exchange was made for a 6 French Brite tip 55 cm sheath. We then again selected the celiac artery with cobra catheter, with serial selection of the splenic  artery and then the splenic artery beyond the aneurysm neck with a microcatheter system. The 65 cm Kumpe the catheter gained purchase within the mid segment of the splenic artery, and the longer, 55 cm Brite tip sheath was advanced into the proximal third of the splenic artery, although we could not advance around the tortuous segment. Again we placed an 0114 transcend wire beyond the neck of the aneurysm. At this point we attempted delivery of the covered stent. The covered stent would not advanced beyond the tortuosity of the mid segment of the splenic artery. The stent was removed and microcatheter was advanced on the transcend wire. We then exchanged for a 014 BMW wire. Another attempt was made to pass the stent. The stent would not pass around the tortuous segment. We then elected to coil embolize the splenic artery in the branches beyond the aneurysm neck and proximal to the aneurysm neck, essentially proximal splenic artery embolization. Once the stent was removed from the system, a penumbra lantern microcatheter was advanced on the BMW wire and the BMW wire was removed. 014 fathom wire was used then to select the downstream artery from the aneurysm neck. A 4 mm penumbra occlusive device was placed. Catheter was withdrawn. We then used the dome of the aneurysm to deflect into the second downstream artery. A 4 mm penumbra occlusive device was placed. Catheter was withdrawn into the more proximal splenic artery. A 6 mm penumbra occlusive device was placed. Additional Ruby coils placed on the proximal aspect of the POD. Final angiogram was performed confirming no flow through the segment. All catheters wires were removed and Exoseal was placed for hemostasis. Patient tolerated procedure well and remained hemodynamically stable throughout. No complications were encountered and no significant blood loss. IMPRESSION: Status post ultrasound guided access right common femoral artery for proximal coil embolization of  the splenic artery for trapping and treatment of ruptured splenic artery aneurysm. ExoSeal deployed for hemostasis. Signed, Yvone Neu. Reyne Dumas, RPVI Vascular and Interventional Radiology Specialists [facility] Radiology Electronically Signed   By: Gilmer Mor D.O.   On: 02/09/2020 09:20   US RENAL  Result Date: 02/08/2020 CLINICAL DATA:  69 year old male with acute renal insufficiency. Suspected hemoperitoneum on recent CT Abdomen and Pelvis. EXAM: RENAL / URINARY TRACT ULTRASOUND COMPLETE COMPARISON:  CT Abdomen and Pelvis 02/07/2020. FINDINGS: Right Kidney: Renal measurements: 11.9 x 5.4 x 5.9 cm = volume: 198 mL. Echogenicity at the upper limits of normal (image 4). No mass or hydronephrosis visualized. Left Kidney: Renal measurements: 13.2 x 6.3 x 6.3 cm = volume: 273 mL. Echogenicity at the upper limits of normal (image 15). No mass or hydronephrosis visualized. Bladder: Appears normal for degree of bladder distention. Other: Free fluid in the both upper quadrants although visible fluid appears fairly simple (images 4 and 13). IMPRESSION: 1. No acute renal finding. Borderline renal cortical echogenicity raising the possibility of some chronic medical renal disease. 2. Small volume of free fluid redemonstrated in both upper quadrants. Electronically Signed   By: Odessa Fleming M.D.   On: 02/08/2020 07:57   IR US Guide Vasc Access Right  Result Date: 02/09/2020 INDICATION: 69 year old male with ruptured  visceral artery aneurysm, splenic artery, presents for angiogram and possible embolization EXAM: ULTRASOUND-GUIDED ACCESS RIGHT COMMON FEMORAL ARTERY MESENTERIC ANGIOGRAM TREATMENT OF RUPTURED VISCERAL ARTERY ANEURYSM FROM THE SPLENIC ARTERY WITH PROXIMAL COIL EMBOLIZATION OF THE SPLENIC ARTERY EXOSEAL MEDICATIONS: None. The antibiotic was administered within 1 hour of the procedure ANESTHESIA/SEDATION: Moderate (conscious) sedation was employed during this procedure. A total of Versed 9.0 mg and Fentanyl 300  mcg was administered intravenously. Moderate Sedation Time: 142 minutes. The patient's level of consciousness and vital signs were monitored continuously by radiology nursing throughout the procedure under my direct supervision. CONTRAST:  Sixty-five cc FLUOROSCOPY TIME:  Fluoroscopy Time: 26 minutes 30 seconds (1859 mGy). COMPLICATIONS: None PROCEDURE: Informed consent was obtained from the patient following explanation of the procedure, risks, benefits and alternatives. The patient understands, agrees and consents for the procedure. All questions were addressed. A time out was performed prior to the initiation of the procedure. Maximal barrier sterile technique utilized including caps, mask, sterile gowns, sterile gloves, large sterile drape, hand hygiene, and Betadine prep. Ultrasound survey of the right inguinal region was performed with images stored and sent to PACs, confirming patency of the vessel. A micropuncture needle was used access the right common femoral artery under ultrasound. With excellent arterial blood flow returned, and an .018 micro wire was passed through the needle, observed enter the abdominal aorta under fluoroscopy. The needle was removed, and a micropuncture sheath was placed over the wire. The inner dilator and wire were removed, and an 035 Bentson wire was advanced under fluoroscopy into the abdominal aorta. The sheath was removed and a standard 5 [place] vascular sheath was placed. The dilator was removed and the sheath was flushed. Cobra catheter was advanced on the Bentson wire to the abdominal aorta and used to select the celiac artery origin. CO2 angiogram was performed. Glidewire was used to navigate the [place] catheter into the mid segment of the splenic artery. Wire was removed and angiogram was performed. Initial strategy was to enter the saccular aneurysm for coil embolization. A microcatheter system was advanced through the base catheter, using an STC microcatheter and a 14  fathom wire. We select the largest microcoils available to our service, which were not large enough to remain within the sac of the aneurysm. The micro coils prolapsed into the native vessel. Our second strategy was then to deliver a covered stent given the diameter of the vessel, to cover the neck of the aneurysm. We elected to use a 5 mm by 5 cm covered Gore viabahn. Rose in wire was passed through the base catheter and exchange was made for a 35 cm bright tip 6 French sheath. 65 cm Kumpe the catheter was then advanced through the sheath into the splenic artery. Through the Kumpe the catheter a microcatheter system was advanced into the splenic artery beyond the aneurysm neck. Once we confirmed location with a small angiogram, an 014 transcend wire was placed. On the first attempted delivery of the covered stent, there was ultimately prolapse of the system into the abdominal aorta. Stent was removed, microwire was removed, and exchange was made for a 6 French Brite tip 55 cm sheath. We then again selected the celiac artery with cobra catheter, with serial selection of the splenic artery and then the splenic artery beyond the aneurysm neck with a microcatheter system. The 65 cm Kumpe the catheter gained purchase within the mid segment of the splenic artery, and the longer, 55 cm Brite tip sheath was advanced into the  proximal third of the splenic artery, although we could not advance around the tortuous segment. Again we placed an 0114 transcend wire beyond the neck of the aneurysm. At this point we attempted delivery of the covered stent. The covered stent would not advanced beyond the tortuosity of the mid segment of the splenic artery. The stent was removed and microcatheter was advanced on the transcend wire. We then exchanged for a 014 BMW wire. Another attempt was made to pass the stent. The stent would not pass around the tortuous segment. We then elected to coil embolize the splenic artery in the branches  beyond the aneurysm neck and proximal to the aneurysm neck, essentially proximal splenic artery embolization. Once the stent was removed from the system, a penumbra lantern microcatheter was advanced on the BMW wire and the BMW wire was removed. 014 fathom wire was used then to select the downstream artery from the aneurysm neck. A 4 mm penumbra occlusive device was placed. Catheter was withdrawn. We then used the dome of the aneurysm to deflect into the second downstream artery. A 4 mm penumbra occlusive device was placed. Catheter was withdrawn into the more proximal splenic artery. A 6 mm penumbra occlusive device was placed. Additional Ruby coils placed on the proximal aspect of the POD. Final angiogram was performed confirming no flow through the segment. All catheters wires were removed and Exoseal was placed for hemostasis. Patient tolerated procedure well and remained hemodynamically stable throughout. No complications were encountered and no significant blood loss. 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 ExoSeal deployed for hemostasis. Signed, Yvone Neu. Reyne Dumas, RPVI [facility] Radiology Electronically Signed   By: Gilmer Mor D.O.   On: 02/09/2020 09:20   CT Renal Stone Study  Result Date: 02/07/2020 CLINICAL DATA:  Flank pain.  Abdominal pain. EXAM: CT ABDOMEN AND PELVIS WITHOUT CONTRAST TECHNIQUE: Multidetector CT imaging of the abdomen and pelvis was performed following the standard protocol without IV contrast. COMPARISON:  None. FINDINGS: Lower chest: Hazy airspace opacities in bilateral lower lobes. Additional areas of dependent atelectasis. Hepatobiliary: No focal liver abnormality is seen. No gallstones, gallbladder wall thickening, or biliary dilatation. Pancreas: Unremarkable. No  pancreatic ductal dilatation or surrounding inflammatory changes. Spleen: Normal in size without focal abnormality. Adrenals/Urinary Tract: Adrenal glands are unremarkable. Kidneys are normal, without renal calculi, focal lesion, or hydronephrosis. Small right peritrigonal bladder diverticulum. Stomach/Bowel: Stomach is within normal limits. The appendix is not seen. No evidence of bowel wall thickening, distention, or inflammatory changes. Diffuse left colonic diverticulosis without evidence of acute diverticulitis. Vascular/Lymphatic: Aortic atherosclerosis. No enlarged abdominal or pelvic lymph nodes. Reproductive: Mild enlargement of the prostate gland with coarse calcifications. Other: Moderate amount of high density fluid is seen within the abdomen and pelvis. There is a rim calcified 2.1 cm structure adjacent to the tail of the pancreas with surrounding fat stranding, which may represent calcified aneurysm or pseudoaneurysm. Musculoskeletal: No acute or significant osseous findings. IMPRESSION: 1. Moderate amount of high density fluid within the abdomen and pelvis, concerning for hemoperitoneum. Alternatively this may represent a ruptured viscus, however the lack of free gas argues against that. A third consideration would be a radio occult acute pancreatitis. CT angiography, conventional angiography and/or surgical consult is recommended. 2. 2.1 cm rim calcified structure adjacent to the tail of the pancreas with surrounding fat stranding, which may  represent calcified vascular aneurysm or pseudoaneurysm. 3. Diffuse left colonic diverticulosis without evidence of acute diverticulitis. 4. Small right peritrigonal bladder diverticulum. 5. Mild enlargement of the prostate gland with coarse calcifications. Please correlate to serum PSA values. 6. Hazy airspace opacities in the dependent portions of the bilateral lower lobes may represent atelectasis or early airspace consolidation. 7. Aortic atherosclerosis.  These results were called by telephone at the time of interpretation on 02/07/2020 at 2:07 pm to provider [facility] , who verbally acknowledged these results. Aortic Atherosclerosis (ICD10-I70.0). Electronically Signed   By: Ted Mcalpine M.D.   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 The antibiotic was administered within 1 hour of the procedure ANESTHESIA/SEDATION: Moderate (conscious) sedation was employed during this procedure. A total of Versed 9.0 mg and Fentanyl 300 mcg was administered intravenously. Moderate Sedation Time: 142 minutes. The patient's level of consciousness and vital signs were monitored continuously by radiology nursing throughout the procedure under my direct supervision. CONTRAST:  Sixty-five cc FLUOROSCOPY TIME:  Fluoroscopy Time: 26 minutes 30 seconds (1859 mGy). COMPLICATIONS: None PROCEDURE: Informed consent was obtained from the patient following explanation of the procedure, risks, benefits and alternatives. The patient understands, agrees and consents for the procedure. All questions were addressed. A time out was performed prior to the initiation of the procedure. Maximal barrier sterile technique utilized including caps, mask, sterile gowns, sterile gloves, large sterile drape, hand hygiene, and Betadine prep. Ultrasound survey of the right inguinal region was performed with images stored and sent to PACs, confirming patency of the vessel. A micropuncture needle was used access the right common femoral artery under ultrasound. With excellent arterial  blood flow returned, and an .018 micro wire was passed through the needle, observed enter the abdominal aorta under fluoroscopy. The needle was removed, and a micropuncture sheath was placed over the wire. The inner dilator and wire were removed, and an 035 Bentson wire was advanced under fluoroscopy into the abdominal aorta. The sheath was removed and a standard 5 [place] vascular sheath was placed. The dilator was removed and the sheath was flushed. Cobra catheter was advanced on the Bentson wire to the abdominal aorta and used to select the celiac artery origin. CO2 angiogram was performed. Glidewire was used to navigate the [place] catheter into the mid segment of the splenic artery. Wire was removed and angiogram was performed. Initial strategy was to enter the saccular aneurysm for coil embolization. A microcatheter system was advanced through the base catheter, using an STC microcatheter and a 14 fathom wire. We select the largest microcoils available to our service, which were not large enough to remain within the sac of the aneurysm. The micro coils prolapsed into the native vessel. Our second strategy was then to deliver a covered stent given the diameter of the vessel, to cover the neck of the aneurysm. We elected to use a 5 mm by 5 cm covered [place] viabahn. Rose in wire was passed through the base catheter and exchange was made for a 35 cm bright tip 6 French sheath. 65 cm Kumpe the catheter was then advanced through the sheath into the splenic artery. Through the  Kumpe the catheter a microcatheter system was advanced into the splenic artery beyond the aneurysm neck. Once we confirmed location with a small angiogram, an 014 transcend wire was placed. On the first attempted delivery of the covered stent, there was ultimately prolapse of the system into the abdominal aorta. Stent was removed, microwire was removed, and exchange was made for a 6 French Brite tip 55 cm sheath. We then again selected the celiac  artery with cobra catheter, with serial selection of the splenic artery and then the splenic artery beyond the aneurysm neck with a microcatheter system. The 65 cm Kumpe the catheter gained purchase within the mid segment of the splenic artery, and the longer, 55 cm Brite tip sheath was advanced into the proximal third of the splenic artery, although we could not advance around the tortuous segment. Again we placed an 0114 transcend wire beyond the neck of the aneurysm. At this point we attempted delivery of the covered stent. The covered stent would not advanced beyond the tortuosity of the mid segment of the splenic artery. The stent was removed and microcatheter was advanced on the transcend wire. We then exchanged for a 014 BMW wire. Another attempt was made to pass the stent. The stent would not pass around the tortuous segment. We then elected to coil embolize the splenic artery in the branches beyond the aneurysm neck and proximal to the aneurysm neck, essentially proximal splenic artery embolization. Once the stent was removed from the system, a penumbra lantern microcatheter was advanced on the BMW wire and the BMW wire was removed. 014 fathom wire was used then to select the downstream artery from the aneurysm neck. A 4 mm penumbra occlusive device was placed. Catheter was withdrawn. We then used the dome of the aneurysm to deflect into the second downstream artery. A 4 mm penumbra occlusive device was placed. Catheter was withdrawn into the more proximal splenic artery. A 6 mm penumbra occlusive device was placed. Additional Ruby coils placed on the proximal aspect of the POD. Final angiogram was performed confirming no flow through the segment. All catheters wires were removed and Exoseal was placed for hemostasis. Patient tolerated procedure well and remained hemodynamically stable throughout. No complications were encountered and no significant blood loss. 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 ExoSeal deployed for hemostasis. Signed, Yvone NeuJaime S. Reyne DumasWagner, DO, RPVI [facility] Electronically Signed   By: Gilmer MorJaime  Wagner D.O.   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 The antibiotic was administered within 1 hour of the procedure ANESTHESIA/SEDATION: Moderate (conscious) sedation was employed during this procedure. A total of Versed 9.0 mg and Fentanyl 300 mcg was administered intravenously. Moderate Sedation Time: 142 minutes. The patient's level of consciousness and vital signs were monitored continuously by radiology nursing throughout the procedure under my direct supervision. CONTRAST:  Sixty-five cc FLUOROSCOPY TIME:  Fluoroscopy Time: 26 minutes 30 seconds (1859 mGy). COMPLICATIONS: None PROCEDURE: Informed consent was obtained from the patient following explanation of the procedure, risks, benefits and alternatives. The  patient understands, agrees and consents for the procedure. All questions were addressed. A time out was performed prior to the initiation of the procedure. Maximal barrier sterile technique utilized including caps, mask, sterile gowns, sterile gloves, large sterile drape, hand hygiene, and Betadine prep. Ultrasound survey of the right inguinal region was performed with images stored and sent to PACs, confirming patency of the vessel. A micropuncture needle  was used access the right common femoral artery under ultrasound. With excellent arterial blood flow returned, and an .018 micro wire was passed through the needle, observed enter the abdominal aorta under fluoroscopy. The needle was removed, and a micropuncture sheath was placed over the wire. The inner dilator and wire were removed, and an 035 Bentson wire was advanced under fluoroscopy into the abdominal aorta. The sheath was removed and a standard 5 [place] vascular sheath was placed. The dilator was removed and the sheath was flushed. Cobra catheter was advanced on the Bentson wire to the abdominal aorta and used to select the celiac artery origin. CO2 angiogram was performed. Glidewire was used to navigate the [place] catheter into the mid segment of the splenic artery. Wire was removed and angiogram was performed. Initial strategy was to enter the saccular aneurysm for coil embolization. A microcatheter system was advanced through the base catheter, using an STC microcatheter and a 14 fathom wire. We select the largest microcoils available to our service, which were not large enough to remain within the sac of the aneurysm. The micro coils prolapsed into the native vessel. Our second strategy was then to deliver a covered stent given the diameter of the vessel, to cover the neck of the aneurysm. We elected to use a 5 mm by 5 cm covered Gore viabahn. Rose in wire was passed through the base catheter and exchange was made for a 35 cm bright tip 6 French sheath. 65 cm Kumpe the catheter was then advanced through the sheath into the splenic artery. Through the Kumpe the catheter a microcatheter system was advanced into the splenic artery beyond the aneurysm neck. Once we confirmed location with a small angiogram, an 014 transcend wire was placed. On the first attempted delivery of the covered stent, there was ultimately prolapse of the system into the abdominal aorta. Stent was removed, microwire was removed, and  exchange was made for a 6 French Brite tip 55 cm sheath. We then again selected the celiac artery with cobra catheter, with serial selection of the splenic artery and then the splenic artery beyond the aneurysm neck with a microcatheter system. The 65 cm Kumpe the catheter gained purchase within the mid segment of the splenic artery, and the longer, 55 cm Brite tip sheath was advanced into the proximal third of the splenic artery, although we could not advance around the tortuous segment. Again we placed an 0114 transcend wire beyond the neck of the aneurysm. At this point we attempted delivery of the covered stent. The covered stent would not advanced beyond the tortuosity of the mid segment of the splenic artery. The stent was removed and microcatheter was advanced on the transcend wire. We then exchanged for a 014 BMW wire. Another attempt was made to pass the stent. The stent would not pass around the tortuous segment. We then elected to coil embolize the splenic artery in the branches beyond the aneurysm neck and proximal to the aneurysm neck, essentially proximal splenic artery embolization. Once the stent was removed from the system, a penumbra lantern microcatheter was advanced on  the BMW wire and the BMW wire was removed. 014 fathom wire was used then to select the downstream artery from the aneurysm neck. A 4 mm penumbra occlusive device was placed. Catheter was withdrawn. We then used the dome of the aneurysm to deflect into the second downstream artery. A 4 mm penumbra occlusive device was placed. Catheter was withdrawn into the more proximal splenic artery. A 6 mm penumbra occlusive device was placed. Additional Ruby coils placed on the proximal aspect of the POD. Final angiogram was performed confirming no flow through the segment. All catheters wires were removed and Exoseal was placed for hemostasis. Patient tolerated procedure well and remained hemodynamically stable throughout. No complications  were encountered and no significant blood loss. 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 ExoSeal deployed for hemostasis. Signed, Yvone Neu. Reyne Dumas, RPVI [facility] Inter[facility] Radiology Special[facility] Radiology Electronically Signed   By: Gilmer Mor D.O.   On: 02/09/2020 09:20    Labs:  CBC: Recent Labs    02/07/20 1152 02/07/20 1152 02/07/20 1732 02/07/20 2109 02/08/20 0308 02/08/20 0308 02/08/20 1137 02/08/20 2214 02/09/20 0302 02/09/20 1201  WBC 17.7*  --  14.9*  --  9.9  --   --   --   --   --   HGB 13.0   < > 11.0*   < > 9.2*  9.3*   < > 9.4* 9.0* 8.6* 9.1*  HCT 38.5*   < > 32.3*   < > 27.6*  28.2*   < > 28.6* 27.1* 26.0* 28.1*  PLT 231  --  193  --  162  --   --   --   --   --    < > = values in this interval not displayed.    COAGS: Recent Labs    02/07/20 2109  INR 1.0    BMP: Recent Labs    02/07/20 1152 02/08/20 0308 02/08/20 2214 02/09/20 0302  NA 137 137 138 138  K 4.6 4.4 3.8 4.3  CL 98 102 103 103  CO2 GLUCOSE 217* 115* 113* 118*  BUN 33* 29* 19 18  CALCIUM 9.9 8.6* 8.3* 8.3*  CREATININE 3.10* 1.52* 1.09 0.97  GFRNONAA 20* 47* >60 >60  GFRAA 23* 54* >60 >60    LIVER FUNCTION TESTS: Recent Labs    02/07/20 1152 02/08/20 0308 02/09/20 0302  BILITOT 0.8 0.8 0.8  AST ALT ALKPHOS 50 35* 38  PROT 7.7 5.9* 5.8*  ALBUMIN 4.7 3.4* 3.6    Assessment and Plan:  Splenic aneurysm, s/p embolization of splenic artery aneurysm 02/08/20 with Dr. Loreta Ave: Hemoglobin level is improved compared to yesterday. Patient has moderate abdominal discomfort which is to be expected. Right femoral vascular site is clean and dry. No evidence of hematoma; minimal tenderness to palpation.   IR will sign off but we are available if needed.    Electronically  Signed: Alwyn Ren, AGACNP-BC [phone] 02/09/2020, 4:04 PM   I spent a total of 15 Minutes at the the patient's bedside AND on the patient's hospital floor or unit, greater than 50% of which was counseling/coordinating care for embolization of splenic artery aneurysm post-procedure evaluation.

## 2020-02-10 LAB — HEMOGLOBIN AND HEMATOCRIT, BLOOD
HCT: 29.4 % — ABNORMAL LOW (ref 39.0–52.0)
Hemoglobin: 9.8 g/dL — ABNORMAL LOW (ref 13.0–17.0)

## 2020-02-10 LAB — COMPREHENSIVE METABOLIC PANEL
ALT: 20 U/L (ref 0–44)
AST: 27 U/L (ref 15–41)
Albumin: 3.2 g/dL — ABNORMAL LOW (ref 3.5–5.0)
Alkaline Phosphatase: 35 U/L — ABNORMAL LOW (ref 38–126)
Anion gap: 5 (ref 5–15)
BUN: 13 mg/dL (ref 8–23)
CO2: 31 mmol/L (ref 22–32)
Calcium: 8.6 mg/dL — ABNORMAL LOW (ref 8.9–10.3)
Chloride: 102 mmol/L (ref 98–111)
Creatinine, Ser: 0.98 mg/dL (ref 0.61–1.24)
GFR calc Af Amer: >60 mL/min (ref >60)
GFR calc non Af Amer: >60 mL/min (ref >60)
Glucose, Bld: 110 mg/dL — ABNORMAL HIGH (ref 70–99)
Potassium: 4.3 mmol/L (ref 3.5–5.1)
Sodium: 138 mmol/L (ref 135–145)
Total Bilirubin: 0.9 mg/dL (ref 0.3–1.2)
Total Protein: 5.3 g/dL — ABNORMAL LOW (ref 6.5–8.1)

## 2020-02-10 LAB — CBC
HCT: 25.3 % — ABNORMAL LOW (ref 39.0–52.0)
Hemoglobin: 8.2 g/dL — ABNORMAL LOW (ref 13.0–17.0)
MCH: 34.2 pg — ABNORMAL HIGH (ref 26.0–34.0)
MCHC: 32.4 g/dL (ref 30.0–36.0)
MCV: 105.4 fL — ABNORMAL HIGH (ref 80.0–100.0)
Platelets: 148 10*3/uL — ABNORMAL LOW (ref 150–400)
RBC: 2.4 MIL/uL — ABNORMAL LOW (ref 4.22–5.81)
RDW: 11.9 % (ref 11.5–15.5)
WBC: 9.4 10*3/uL (ref 4.0–10.5)
nRBC: 0 % (ref 0.0–0.2)

## 2020-02-10 MED ORDER — OXYCODONE HCL 5 MG PO TABS
5.0000 mg | ORAL_TABLET | Freq: Three times a day (TID) | ORAL | 0 refills | Status: AC | PRN
Start: 2020-02-10 — End: 2020-02-15

## 2020-02-10 MED ORDER — PNEUMOCOCCAL 13-VAL CONJ VACC IM SUSP
0.5000 mL | INTRAMUSCULAR | Status: AC
  Administered 2020-02-10: 0.5 mL via INTRAMUSCULAR
  Filled 2020-02-10: qty 0.5

## 2020-02-10 MED ORDER — POLYETHYLENE GLYCOL 3350 17 G PO PACK: 17.0000 g | PACK | Freq: Every day | ORAL | 0 refills | Status: DC

## 2020-02-10 MED ORDER — PANTOPRAZOLE SODIUM 40 MG PO TBEC: 40.0000 mg | DELAYED_RELEASE_TABLET | Freq: Every day | ORAL | 0 refills | Status: DC

## 2020-02-10 MED ORDER — MENINGOCOCCAL A C Y&W-135 OLIG IM SOLR
0.5000 mL | Freq: Once | INTRAMUSCULAR | Status: AC
  Administered 2020-02-10: 0.5 mL via INTRAMUSCULAR
  Filled 2020-02-10: qty 0.5

## 2020-02-10 MED ORDER — HAEMOPHILUS B POLYSAC CONJ VAC 10 MCG IJ SOLR
0.5000 mL | Freq: Once | INTRAMUSCULAR | Status: AC
  Administered 2020-02-10: 0.5 mL via INTRAMUSCULAR
  Filled 2020-02-10: qty 0.5

## 2020-02-10 NOTE — TOC Progression Note (Signed)
Transition of Care [facility]) - Progression Note    Patient Details  Name: Sirius Woodford MRN: [id number] Date of Birth: 1951/01/24  [facility] CM/SW Contact  Golda Acre, RN Phone Number: 02/10/2020, 9:24 AM  Clinical Narrative:    Hepatic renal artery embolization doen on [id number], patient had considerable pain afterwards but controlled with iv pain meds, iv lr at 100cc/hr, [place] at 2l/mikn,alret and reactive. Plan to return to home.   Expected Discharge Plan: Home/Self Care Barriers to Discharge: Continued Medical Work up  Expected Discharge Plan and Services Expected Discharge Plan: Home/Self Care   Discharge Planning Services: CM Consult   Living arrangements for the past 2 months: Single Family Home                                       Social Determinants of Health (SDOH) Interventions    Readmission Risk Interventions No flowsheet data found.

## 2020-02-10 NOTE — Progress Notes (Signed)
Called the patient's PCP Dr. Salvadore Farber [phone], as requested by his PCP.  No answer.  Left a voicemail.  Will call again.

## 2020-02-10 NOTE — Discharge Instructions (Signed)
Laparoscopic Splenectomy, Care After This sheet gives you information about how to care for yourself after your procedure. Your health care provider may also give you more specific instructions. If you have problems or questions, contact your health care provider. What can I expect after the procedure? After the procedure, it is common to have:  Mild pain.  Temporary lack of energy. Follow these instructions at home: Medicines  Take over-the-counter and prescription medicines only as told by your health care provider.  If you were prescribed an antibiotic medicine, take it as told by your health care provider. Do not stop taking the antibiotic even if you start to feel better.  Talk with your health care provider about whether you need vaccinations to help prevent infections. Not having a spleen makes certain infections more dangerous because it weakens the body's disease-fighting system (immune system). Driving  Do not drive or use heavy machinery while taking prescription pain medicine. Incision care  Follow instructions from your health care provider about how to take care of your incisions. Make sure you: ? Wash your hands with soap and water before you change your bandage (dressing). If soap and water are not available, use hand sanitizer. ? Change your dressing as told by your health care provider. ? Leave stitches (sutures), skin glue, or adhesive strips in place. These skin closures may need to stay in place for 2 weeks or longer. If adhesive strip edges start to loosen and curl up, you may trim the loose edges. Do not remove adhesive strips completely unless your health care provider tells you to do that.  Check your incision areas every day for signs of infection. Check for: ? Redness, swelling, or pain. ? Fluid or blood. ? Warmth. ? Pus or a bad smell. Activity   Avoid sitting for a long time without moving. Get up to take short walks every 1-2 hours. This is important to  improve blood flow and breathing. Ask for help if you feel weak or unsteady.  Avoid activities that require a lot of energy (strenuous activities). Ask your health care provider what activities are safe for you and when you may return to your normal activities.  Do not lift anything that is heavier than 10 lb (4.5 kg), or the limit that you are told, until your health care provider says that it is safe. General instructions   Drink enough fluid to keep your urine pale yellow.  Do deep breathing exercises as told by your health care provider. These help to prevent lung infection (pneumonia).  If you are taking prescription pain medicine, take actions to prevent or treat constipation. Your health care provider may recommend that you: ? Drink enough fluid to keep your urine pale yellow. ? Eat foods that are high in fiber, such as fresh fruits and vegetables, whole grains, and beans. ? Limit foods that are high in fat and processed sugars, such as fried or sweet foods. ? Take an over-the-counter or prescription medicine for constipation.  Do not take baths, swim, or use a hot tub until your health care provider approves. Ask your health care provider if you may take showers. You may only be allowed to take sponge baths.  Keep all follow-up visits as told by your health care provider. This is important. Contact a health care provider if you:  Have pain that does not get better with medicine.  Have redness, swelling, or pain around an incision.  Have fluid or blood coming from an incision.  Notice that an incision feels warm to the touch.  Have pus or a bad smell coming from an incision.  Have a fever or chills.  Have a sore throat.  Have nausea or vomiting. Get help right away if you:  Have trouble breathing.  Notice that your legs are red, swollen, or painful.  Suddenly feel very weak or dizzy.  Have chest pain. Summary  Get up to take short walks every 1-2 hours. This is  important to improve blood flow and breathing.  Check your incision areas every day for signs of infection, such as redness or swelling.  Talk with your health care provider about whether you need vaccinations to help prevent infections.  You should get help right away if your legs are red, swollen, or painful. This information is not intended to replace advice given to you by your health care provider. Make sure you discuss any questions you have with your health care provider. Document Revised: 04/12/2017 Document Reviewed: 03/13/2017 [employer] Patient Education  2020 [employer] Enlarged Spleen  An enlarged spleen (splenomegaly) is when the spleen is larger than normal. The spleen is an organ that is located in the upper left area of the abdomen, just under the ribs. The spleen is like a storage unit for red blood cells, and it also works to filter and clean the blood. It destroys cells that are damaged or worn out. The spleen is also important for fighting disease. This condition is usually noticed when the spleen is almost twice its normal size. An enlarged spleen is usually a sign of another health problem. What are the causes? This condition may be caused by:  Mononucleosis and other viral infections.  Infection with certain bacteria or parasites.  Liver failure (cirrhosis) and other liver diseases.  Blood diseases, such as hemolytic anemia.  Blood cancers, such as leukemia or Hodgkin's disease. Other causes include:  Tumors and fluid-filled sacs (cysts).  Metabolic disorders, such as Gaucher disease or Niemann-Pick disease.  Pressure or blood clots in the veins of the spleen.  Connective tissue disorders, such as lupus or rheumatoid arthritis. What are the signs or symptoms? Symptoms of this condition include:  Pain in the upper left part of the abdomen. The pain may spread to the left shoulder or get worse when you take a breath.  Feeling full without eating or  after eating only a small amount.  Feeling tired.  Chronic infections.  Bleeding or bruising easily. In some cases, there are no symptoms. How is this diagnosed? This condition is diagnosed based on:  A physical exam. Your health care provider will feel the left upper part of your abdomen.  Tests, such as: ? Blood tests to check red and white blood cells and other proteins and enzymes. ? A biopsy. During a biopsy, a tissue sample of the liver or bone marrow may be removed and looked at under a microscope. A biopsy may be done if there is concern that the liver or bone marrow is causing the enlarged spleen. ? An abdominal ultrasound. ? A CT scan. ? An MRI. How is this treated? Treatment for this condition depends on the cause. Treatment aims to:  Manage the conditions that cause enlargement of the spleen.  Reduce the size of the spleen. Treatment may include:  Medicines to treat infection or disease.  Radiation therapy.  Blood transfusions. If these treatments do not help or if the cause cannot be found, surgery to remove the spleen (splenectomy) may be  recommended. After surgery, you may need to have vaccinations or take antibiotics to prevent infections. Follow these instructions at home: Medicines  Take over-the-counter and prescription medicines only as told by your health care provider.  If you were prescribed an antibiotic medicine, take it as told by your health care provider. Do not stop taking the antibiotic even if you start to feel better.  Talk with your health care provider about whether you need vaccinations to help prevent infections. This may be needed if treatment included surgery to remove the spleen. Not having a spleen makes certain infections more dangerous because it weakens the body's disease-fighting system (immune system). General instructions  Follow instructions from your health care provider about limiting your activities. To avoid injury or a  ruptured spleen, make sure you: ? Avoid contact sports. ? Wear a seat belt in the car.  Keep all follow-up visits as told by your health care provider. This is important. Contact a health care provider if:  Your symptoms do not improve as expected.  You have a fever or chills.  You feel generally ill.  You have increased pain when you take in a breath. Get help right away if you:  Experience an injury or impact to the spleen area.  Have abdominal pain that becomes severe.  Feel dizzy or you faint.  Feel very weak.  Have cold and clammy skin.  Have sweating for no reason.  Have chest pain or difficulty breathing. Summary  An enlarged spleen (splenomegaly) is when the spleen is larger than normal.  An enlarged spleen is usually a sign of another health problem.  This condition is treated with medicines, radiation therapy, blood transfusions, vaccines, or surgery. This information is not intended to replace advice given to you by your health care provider. Make sure you discuss any questions you have with your health care provider. Document Revised: 05/29/2018 Document Reviewed: 03/18/2018 Else[employer] Patient Education  2020 [employer].

## 2020-02-10 NOTE — Progress Notes (Signed)
Subjective: Having some LUQ pain as expected.  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  He is eating some, not a great diet, but no nausea.  Passing flatus  ROS: See above, otherwise other systems negative  Objective: Vital signs in last 24 hours: Temp:  [97.8 F (36.6 C)-99.1 F (37.3 C)] 98.3 F (36.8 C) (09/29 0800) Pulse Rate:  [57-79] 73 (09/29 0900) Resp:  [9-23] 13 (09/29 0900) BP: (115-166)/(62-97) 166/90 (09/29 0900) SpO2:  [88 %-99 %] 99 % (09/29 0900) Last BM Date: 02/08/20  Intake/Output from previous day: 09/28 0701 - 09/29 0700 In: 4087.8 [P.O.:480; I.V.:3607.8] Out: 2200 [Urine:2200] Intake/Output this shift: No intake/output data recorded.  PE: Abd: soft, tender some in LUQ as expected, otherwise +BS, ND  Lab Results:  Recent Labs    02/08/20 0308 02/08/20 1137 02/09/20 1921 02/10/20 0246  WBC 9.9  --   --  9.4  HGB 9.2*  9.3*   < > 8.4* 8.2*  HCT 27.6*  28.2*   < > 26.4* 25.3*  PLT 162  --   --  148*   < > = values in this interval not displayed.   BMET Recent Labs    02/09/20 0302 02/10/20 0246  NA 138 138  K 4.3 4.3  CL 103 102  CO2 28 31  GLUCOSE 118* 110*  BUN 18 13  CREATININE 0.97 0.98  CALCIUM 8.3* 8.6*   PT/INR Recent Labs    02/07/20 2109  LABPROT 12.3  INR 1.0   CMP     Component Value Date/Time   NA 138 02/10/2020 0246   K 4.3 02/10/2020 0246   CL 102 02/10/2020 0246   CO2 31 02/10/2020 0246   GLUCOSE 110 (H) 02/10/2020 0246   BUN 13 02/10/2020 0246   CREATININE 0.98 02/10/2020 0246   CALCIUM 8.6 (L) 02/10/2020 0246   PROT 5.3 (L) 02/10/2020 0246   ALBUMIN 3.2 (L) 02/10/2020 0246   AST 27 02/10/2020 0246   ALT 20 02/10/2020 0246   ALKPHOS 35 (L) 02/10/2020 0246   BILITOT 0.9 02/10/2020 0246   GFRNONAA >60 02/10/2020 0246   GFRAA >60 02/10/2020 0246   Lipase     Component Value Date/Time   LIPASE 22 02/07/2020 1152        Studies/Results: IR Angiogram Visceral Selective  Result Date: 02/09/2020 INDICATION: 69 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 The antibiotic was administered within 1 hour of the procedure ANESTHESIA/SEDATION: Moderate (conscious) sedation was employed during this procedure. A total of Versed 9.0 mg and Fentanyl 300 mcg was administered intravenously. Moderate Sedation Time: 142 minutes. The patient's level of consciousness and vital signs were monitored continuously by radiology nursing throughout the procedure under my direct supervision. CONTRAST:  Sixty-five cc FLUOROSCOPY TIME:  Fluoroscopy Time: 26 minutes 30 seconds (1859 mGy). COMPLICATIONS: None PROCEDURE: Informed consent was obtained from the patient following explanation of the procedure, risks, benefits and alternatives. The patient understands, agrees and consents for the procedure. All questions were addressed. A time out was performed prior to the initiation of the procedure. Maximal barrier sterile technique utilized including caps, mask, sterile gowns, sterile gloves, large sterile drape, hand hygiene, and Betadine  prep. Ultrasound survey of the right inguinal region was performed with images stored and sent to PACs, confirming patency of the vessel. A micropuncture needle was used access the right common femoral artery under ultrasound. With excellent arterial blood flow returned, and an .018 micro wire was passed through the needle, observed enter the abdominal aorta under fluoroscopy. The needle was removed, and a micropuncture sheath was placed over the wire. The inner dilator and wire were removed, and an 035 Bentson  wire was advanced under fluoroscopy into the abdominal aorta. The sheath was removed and a standard 5 [place] vascular sheath was placed. The dilator was removed and the sheath was flushed. Cobra catheter was advanced on the Bentson wire to the abdominal aorta and used to select the celiac artery origin. CO2 angiogram was performed. Glidewire was used to navigate the [place]edy catheter into the mid segment of the splenic artery. Wire was removed and angiogram was performed. Initial strategy was to enter the saccular aneurysm for coil embolization. A microcatheter system was advanced through the base catheter, using an STC microcatheter and a 14 fathom wire. We select the largest microcoils available to our service, which were not large enough to remain within the sac of the aneurysm. The micro coils prolapsed into the native vessel. Our second strategy was then to deliver a covered stent given the diameter of the vessel, to cover the neck of the aneurysm. We elected to use a 5 mm by 5 cm covered Gore viabahn. Rose in wire was passed through the base catheter and exchange was made for a 35 cm bright tip 6 French sheath. 65 cm Kumpe the catheter was then advanced through the sheath into the splenic artery. Through the Kumpe the catheter a microcatheter system was advanced into the splenic artery beyond the aneurysm neck. Once we confirmed location with a small angiogram, an 014 transcend wire was placed. On the first attempted delivery of the covered stent, there was ultimately prolapse of the system into the abdominal aorta. Stent was removed, microwire was removed, and exchange was made for a 6 French Brite tip 55 cm sheath. We then again selected the celiac artery with cobra catheter, with serial selection of the splenic artery and then the splenic artery beyond the aneurysm neck with a microcatheter system. The 65 cm Kumpe the catheter gained purchase within the mid segment of the splenic artery, and the longer, 55 cm  Brite tip sheath was advanced into the proximal third of the splenic artery, although we could not advance around the tortuous segment. Again we placed an 0114 transcend wire beyond the neck of the aneurysm. At this point we attempted delivery of the covered stent. The covered stent would not advanced beyond the tortuosity of the mid segment of the splenic artery. The stent was removed and microcatheter was advanced on the transcend wire. We then exchanged for a 014 BMW wire. Another attempt was made to pass the stent. The stent would not pass around the tortuous segment. We then elected to coil embolize the splenic artery in the branches beyond the aneurysm neck and proximal to the aneurysm neck, essentially proximal splenic artery embolization. Once the stent was removed from the system, a penumbra lantern microcatheter was advanced on the BMW wire and the BMW wire was removed. 014 fathom wire was used then to select the downstream artery from the aneurysm neck. A 4 mm penumbra occlusive device was placed. Catheter was withdrawn. We then used the dome of the aneurysm  to deflect into the second downstream artery. A 4 mm penumbra occlusive device was placed. Catheter was withdrawn into the more proximal splenic artery. A 6 mm penumbra occlusive device was placed. Additional Ruby coils placed on the proximal aspect of the POD. Final angiogram was performed confirming no flow through the segment. All catheters wires were removed and Exoseal was placed for hemostasis. Patient tolerated procedure well and remained hemodynamically stable throughout. No complications were encountered and no significant blood loss. 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 ExoSeal deployed for hemostasis. Signed, Yvone Neu. Reyne Dumas, RPVI [facility]  [facility] Radiology Electronically Signed   By: Gilmer Mor D.O.   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 The antibiotic was administered within 1 hour of the procedure ANESTHESIA/SEDATION: Moderate (conscious) sedation was employed during this procedure. A total of Versed 9.0 mg and Fentanyl 300 mcg was administered intravenously. Moderate Sedation Time: 142 minutes. The patient's level of consciousness and vital signs were monitored continuously by radiology nursing throughout the procedure under my direct supervision. CONTRAST:  Sixty-five cc FLUOROSCOPY TIME:  Fluoroscopy Time: 26 minutes 30 seconds (1859 mGy). COMPLICATIONS: None PROCEDURE: Informed consent was obtained from the patient following explanation of the procedure, risks, benefits and alternatives. The patient understands, agrees and consents for the procedure. All questions were addressed. A time out was performed prior to the initiation of the procedure. Maximal barrier sterile technique utilized including caps, mask, sterile gowns, sterile gloves, large sterile drape, hand hygiene, and Betadine prep. Ultrasound survey of the right inguinal region was performed with images stored and sent to PACs, confirming patency of the vessel. A micropuncture needle was used access the right common femoral artery under ultrasound. With excellent arterial blood flow returned, and an .018 micro wire was passed through the needle, observed enter the abdominal aorta under fluoroscopy. The needle was removed, and a micropuncture sheath was placed  over the wire. The inner dilator and wire were removed, and an 035 Bentson wire was advanced under fluoroscopy into the abdominal aorta. The sheath was removed and a standard 5 [place] vascular sheath was placed. The dilator was removed and the sheath was flushed. Cobra catheter was advanced on the Bentson wire to the abdominal aorta and used to select the celiac artery origin. CO2 angiogram was performed. Glidewire was used to navigate the [place] catheter into the mid segment of the splenic artery. Wire was removed and angiogram was performed. Initial strategy was to enter the saccular aneurysm for coil embolization. A microcatheter system was advanced through the base catheter, using an STC microcatheter and a 14 fathom wire. We select the largest microcoils available to our service, which were not large enough to remain within the sac of the aneurysm. The micro coils prolapsed into the native vessel. Our second strategy was then to deliver a covered stent given the diameter of the vessel, to cover the neck of the aneurysm. We elected to use a 5 mm by 5 cm covered Gore viabahn. Rose in wire was passed through the base catheter  and exchange was made for a 35 cm bright tip 6 French sheath. 65 cm Kumpe the catheter was then advanced through the sheath into the splenic artery. Through the Kumpe the catheter a microcatheter system was advanced into the splenic artery beyond the aneurysm neck. Once we confirmed location with a small angiogram, an 014 transcend wire was placed. On the first attempted delivery of the covered stent, there was ultimately prolapse of the system into the abdominal aorta. Stent was removed, microwire was removed, and exchange was made for a 6 French Brite tip 55 cm sheath. We then again selected the celiac artery with cobra catheter, with serial selection of the splenic artery and then the splenic artery beyond the aneurysm neck with a microcatheter system. The 65 cm Kumpe the catheter gained  purchase within the mid segment of the splenic artery, and the longer, 55 cm Brite tip sheath was advanced into the proximal third of the splenic artery, although we could not advance around the tortuous segment. Again we placed an 0114 transcend wire beyond the neck of the aneurysm. At this point we attempted delivery of the covered stent. The covered stent would not advanced beyond the tortuosity of the mid segment of the splenic artery. The stent was removed and microcatheter was advanced on the transcend wire. We then exchanged for a 014 BMW wire. Another attempt was made to pass the stent. The stent would not pass around the tortuous segment. We then elected to coil embolize the splenic artery in the branches beyond the aneurysm neck and proximal to the aneurysm neck, essentially proximal splenic artery embolization. Once the stent was removed from the system, a penumbra lantern microcatheter was advanced on the BMW wire and the BMW wire was removed. 014 fathom wire was used then to select the downstream artery from the aneurysm neck. A 4 mm penumbra occlusive device was placed. Catheter was withdrawn. We then used the dome of the aneurysm to deflect into the second downstream artery. A 4 mm penumbra occlusive device was placed. Catheter was withdrawn into the more proximal splenic artery. A 6 mm penumbra occlusive device was placed. Additional Ruby coils placed on the proximal aspect of the POD. Final angiogram was performed confirming no flow through the segment. All catheters wires were removed and Exoseal was placed for hemostasis. Patient tolerated procedure well and remained hemodynamically stable throughout. No complications were encountered and no significant blood loss. 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 ExoSeal deployed for hemostasis. Signed,  Yvone Neu. Reyne Dumas, RPVI [facility] Radiology Electronically Signed   By: Gilmer Mor D.O.   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 The antibiotic was administered within 1 hour of the procedure ANESTHESIA/SEDATION: Moderate (conscious) sedation was employed during this procedure. A total of Versed 9.0 mg and Fentanyl 300 mcg was administered intravenously. Moderate Sedation Time: 142 minutes. The patient's level of consciousness and vital signs were monitored continuously by radiology nursing throughout the procedure under my direct supervision. CONTRAST:  Sixty-five cc FLUOROSCOPY TIME:  Fluoroscopy  Time: 26 minutes 30 seconds (1859 mGy). COMPLICATIONS: None PROCEDURE: Informed consent was obtained from the patient following explanation of the procedure, risks, benefits and alternatives. The patient understands, agrees and consents for the procedure. All questions were addressed. A time out was performed prior to the initiation of the procedure. Maximal barrier sterile technique utilized including caps, mask, sterile gowns, sterile gloves, large sterile drape, hand hygiene, and Betadine prep. Ultrasound survey of the right inguinal region was performed with images stored and sent to PACs, confirming patency of the vessel. A micropuncture needle was used access the right common femoral artery under ultrasound. With excellent arterial blood flow returned, and an .018 micro wire was passed through the needle, observed enter the abdominal aorta  under fluoroscopy. The needle was removed, and a micropuncture sheath was placed over the wire. The inner dilator and wire were removed, and an 035 Bentson wire was advanced under fluoroscopy into the abdominal aorta. The sheath was removed and a standard 5 [place] vascular sheath was placed. The dilator was removed and the sheath was flushed. Cobra catheter was advanced on the Bentson wire to the abdominal aorta and used to select the celiac artery origin. CO2 angiogram was performed. Glidewire was used to navigate the [place] catheter into the mid segment of the splenic artery. Wire was removed and angiogram was performed. Initial strategy was to enter the saccular aneurysm for coil embolization. A microcatheter system was advanced through the base catheter, using an STC microcatheter and a 14 fathom wire. We select the largest microcoils available to our service, which were not large enough to remain within the sac of the aneurysm. The micro coils prolapsed into the native vessel. Our second strategy was then to deliver a covered stent given the diameter of the vessel, to cover the neck of the aneurysm. We elected to use a 5 mm by 5 cm covered Gore viabahn. Rose in wire was passed through the base catheter and exchange was made for a 35 cm bright tip 6 French sheath. 65 cm Kumpe the catheter was then advanced through the sheath into the splenic artery. Through the Kumpe the catheter a microcatheter system was advanced into the splenic artery beyond the aneurysm neck. Once we confirmed location with a small angiogram, an 014 transcend wire was placed. On the first attempted delivery of the covered stent, there was ultimately prolapse of the system into the abdominal aorta. Stent was removed, microwire was removed, and exchange was made for a 6 French Brite tip 55 cm sheath. We then again selected the celiac artery with cobra catheter, with serial selection of the splenic artery and then the splenic artery beyond the  aneurysm neck with a microcatheter system. The 65 cm Kumpe the catheter gained purchase within the mid segment of the splenic artery, and the longer, 55 cm Brite tip sheath was advanced into the proximal third of the splenic artery, although we could not advance around the tortuous segment. Again we placed an 0114 transcend wire beyond the neck of the aneurysm. At this point we attempted delivery of the covered stent. The covered stent would not advanced beyond the tortuosity of the mid segment of the splenic artery. The stent was removed and microcatheter was advanced on the transcend wire. We then exchanged for a 014 BMW wire. Another attempt was made to pass the stent. The stent would not pass around the tortuous segment. We then elected to coil embolize the splenic artery in the branches beyond the aneurysm  neck and proximal to the aneurysm neck, essentially proximal splenic artery embolization. Once the stent was removed from the system, a penumbra lantern microcatheter was advanced on the BMW wire and the BMW wire was removed. 014 fathom wire was used then to select the downstream artery from the aneurysm neck. A 4 mm penumbra occlusive device was placed. Catheter was withdrawn. We then used the dome of the aneurysm to deflect into the second downstream artery. A 4 mm penumbra occlusive device was placed. Catheter was withdrawn into the more proximal splenic artery. A 6 mm penumbra occlusive device was placed. Additional Ruby coils placed on the proximal aspect of the POD. Final angiogram was performed confirming no flow through the segment. All catheters wires were removed and Exoseal was placed for hemostasis. Patient tolerated procedure well and remained hemodynamically stable throughout. No complications were encountered and no significant blood loss. IMPRESSION: Status post ultrasound guided access right common femoral artery for proximal coil embolization of the splenic artery for trapping and treatment  of ruptured splenic artery aneurysm. ExoSeal deployed for hemostasis. Signed, Yvone Neu. Reyne Dumas, RPVI [facility] Radiology Electronically Signed   By: Gilmer Mor D.O.   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 The antibiotic was administered within 1 hour of the procedure ANESTHESIA/SEDATION: Moderate (conscious) sedation was employed during this procedure. A total of Versed 9.0 mg and Fentanyl 300 mcg was administered intravenously. Moderate Sedation Time: 142 minutes. The patient's level of consciousness and vital signs were monitored continuously by radiology nursing throughout the procedure under my direct supervision. CONTRAST:  Sixty-five cc FLUOROSCOPY TIME:  Fluoroscopy Time: 26 minutes 30 seconds (1859 mGy). COMPLICATIONS: None PROCEDURE: Informed consent was obtained from the patient following explanation of the procedure, risks, benefits and alternatives. The patient understands, agrees and consents for the procedure. All questions were addressed. A time out was performed prior to the initiation of the procedure. Maximal barrier sterile technique utilized including caps, mask, sterile gowns, sterile gloves, large sterile drape, hand hygiene, and Betadine prep. Ultrasound survey of the right inguinal region was performed with images stored and sent to PACs, confirming patency of the vessel. A micropuncture needle was used access the right common femoral artery under ultrasound. With excellent arterial blood flow returned, and an .018 micro wire was  passed through the needle, observed enter the abdominal aorta under fluoroscopy. The needle was removed, and a micropuncture sheath was placed over the wire. The inner dilator and wire were removed, and an 035 Bentson wire was advanced under fluoroscopy into the abdominal aorta. The sheath was removed and a standard 5 [place] vascular sheath was placed. The dilator was removed and the sheath was flushed. Cobra catheter was advanced on the Bentson wire to the abdominal aorta and used to select the celiac artery origin. CO2 angiogram was performed. Glidewire was used to navigate the [place] catheter into the mid segment of the splenic artery. Wire was removed and angiogram was performed. Initial strategy was to enter the saccular aneurysm for coil embolization. A microcatheter system was advanced through the base catheter, using an STC microcatheter and a 14 fathom wire. We select the largest microcoils  available to our service, which were not large enough to remain within the sac of the aneurysm. The micro coils prolapsed into the native vessel. Our second strategy was then to deliver a covered stent given the diameter of the vessel, to cover the neck of the aneurysm. We elected to use a 5 mm by 5 cm covered Gore viabahn. Rose in wire was passed through the base catheter and exchange was made for a 35 cm bright tip 6 French sheath. 65 cm Kumpe the catheter was then advanced through the sheath into the splenic artery. Through the Kumpe the catheter a microcatheter system was advanced into the splenic artery beyond the aneurysm neck. Once we confirmed location with a small angiogram, an 014 transcend wire was placed. On the first attempted delivery of the covered stent, there was ultimately prolapse of the system into the abdominal aorta. Stent was removed, microwire was removed, and exchange was made for a 6 French Brite tip 55 cm sheath. We then again selected the celiac artery with cobra catheter, with serial selection  of the splenic artery and then the splenic artery beyond the aneurysm neck with a microcatheter system. The 65 cm Kumpe the catheter gained purchase within the mid segment of the splenic artery, and the longer, 55 cm Brite tip sheath was advanced into the proximal third of the splenic artery, although we could not advance around the tortuous segment. Again we placed an 0114 transcend wire beyond the neck of the aneurysm. At this point we attempted delivery of the covered stent. The covered stent would not advanced beyond the tortuosity of the mid segment of the splenic artery. The stent was removed and microcatheter was advanced on the transcend wire. We then exchanged for a 014 BMW wire. Another attempt was made to pass the stent. The stent would not pass around the tortuous segment. We then elected to coil embolize the splenic artery in the branches beyond the aneurysm neck and proximal to the aneurysm neck, essentially proximal splenic artery embolization. Once the stent was removed from the system, a penumbra lantern microcatheter was advanced on the BMW wire and the BMW wire was removed. 014 fathom wire was used then to select the downstream artery from the aneurysm neck. A 4 mm penumbra occlusive device was placed. Catheter was withdrawn. We then used the dome of the aneurysm to deflect into the second downstream artery. A 4 mm penumbra occlusive device was placed. Catheter was withdrawn into the more proximal splenic artery. A 6 mm penumbra occlusive device was placed. Additional Ruby coils placed on the proximal aspect of the POD. Final angiogram was performed confirming no flow through the segment. All catheters wires were removed and Exoseal was placed for hemostasis. Patient tolerated procedure well and remained hemodynamically stable throughout. No complications were encountered and no significant blood loss. 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 ExoSeal deployed for hemostasis. Signed, Yvone Neu. Reyne Dumas, RPVI [facility] Rad[facility] Radiology Electronically Signed   By: Gilmer Mor D.O.   On: 02/09/2020 09:20   IR TRANSCATH PLC STENT 1ST ART NOT LE CV CAR VERT CAR  Result Date: 02/09/2020 INDICATION: 70 year old male with ruptured visceral artery aneurysm, splenic artery, presents for angiogram and possible embolization EXAM: ULTRASOUND-GUIDED ACCESS RIGHT COMMON FEMORAL ARTERY MESENTERIC ANGIOGRAM TREATMENT OF RUPTURED VISCERAL ARTERY ANEURYSM FROM THE SPLENIC ARTERY WITH  PROXIMAL COIL EMBOLIZATION OF THE SPLENIC ARTERY EXOSEAL MEDICATIONS: None. The antibiotic was administered within 1 hour of the procedure ANESTHESIA/SEDATION: Moderate (conscious) sedation was employed during this procedure. A total of Versed 9.0 mg and Fentanyl 300 mcg was administered intravenously. Moderate Sedation Time: 142 minutes. The patient's level of consciousness and vital signs were monitored continuously by radiology nursing throughout the procedure under my direct supervision. CONTRAST:  Sixty-five cc FLUOROSCOPY TIME:  Fluoroscopy Time: 26 minutes 30 seconds (1859 mGy). COMPLICATIONS: None PROCEDURE: Informed consent was obtained from the patient following explanation of the procedure, risks, benefits and alternatives. The patient understands, agrees and consents for the procedure. All questions were addressed. A time out was performed prior to the initiation of the procedure. Maximal barrier sterile technique utilized including caps, mask, sterile gowns, sterile gloves, large sterile drape, hand hygiene, and Betadine prep. Ultrasound survey of the right inguinal region was performed with images stored and sent to PACs, confirming patency of the vessel. A micropuncture needle was used access the right common femoral artery under  ultrasound. With excellent arterial blood flow returned, and an .018 micro wire was passed through the needle, observed enter the abdominal aorta under fluoroscopy. The needle was removed, and a micropuncture sheath was placed over the wire. The inner dilator and wire were removed, and an 035 Bentson wire was advanced under fluoroscopy into the abdominal aorta. The sheath was removed and a standard 5 [place] vascular sheath was placed. The dilator was removed and the sheath was flushed. Cobra catheter was advanced on the Bentson wire to the abdominal aorta and used to select the celiac artery origin. CO2 angiogram was performed. Glidewire was used to navigate the [place] catheter into the mid segment of the splenic artery. Wire was removed and angiogram was performed. Initial strategy was to enter the saccular aneurysm for coil embolization. A microcatheter system was advanced through the base catheter, using an STC microcatheter and a 14 fathom wire. We select the largest microcoils available to our service, which were not large enough to remain within the sac of the aneurysm. The micro coils prolapsed into the native vessel. Our second strategy was then to deliver a covered stent given the diameter of the vessel, to cover the neck of the aneurysm. We elected to use a 5 mm by 5 cm covered Gore viabahn. Rose in wire was passed through the base catheter and exchange was made for a 35 cm bright tip 6 French sheath. 65 cm Kumpe the catheter was then advanced through the sheath into the splenic artery. Through the Kumpe the catheter a microcatheter system was advanced into the splenic artery beyond the aneurysm neck. Once we confirmed location with a small angiogram, an 014 transcend wire was placed. On the first attempted delivery of the covered stent, there was ultimately prolapse of the system into the abdominal aorta. Stent was removed, microwire was removed, and exchange was made for a 6 French Brite tip 55 cm sheath.  We then again selected the celiac artery with cobra catheter, with serial selection of the splenic artery and then the splenic artery beyond the aneurysm neck with a microcatheter system. The 65 cm Kumpe the catheter gained purchase within the mid segment of the splenic artery, and the longer, 55 cm Brite tip sheath was advanced into the proximal third of the splenic artery, although we could not advance around the tortuous segment. Again we placed an 0114 transcend wire beyond the neck of the aneurysm. At this point  we attempted delivery of the covered stent. The covered stent would not advanced beyond the tortuosity of the mid segment of the splenic artery. The stent was removed and microcatheter was advanced on the transcend wire. We then exchanged for a 014 BMW wire. Another attempt was made to pass the stent. The stent would not pass around the tortuous segment. We then elected to coil embolize the splenic artery in the branches beyond the aneurysm neck and proximal to the aneurysm neck, essentially proximal splenic artery embolization. Once the stent was removed from the system, a penumbra lantern microcatheter was advanced on the BMW wire and the BMW wire was removed. 014 fathom wire was used then to select the downstream artery from the aneurysm neck. A 4 mm penumbra occlusive device was placed. Catheter was withdrawn. We then used the dome of the aneurysm to deflect into the second downstream artery. A 4 mm penumbra occlusive device was placed. Catheter was withdrawn into the more proximal splenic artery. A 6 mm penumbra occlusive device was placed. Additional Ruby coils placed on the proximal aspect of the POD. Final angiogram was performed confirming no flow through the segment. All catheters wires were removed and Exoseal was placed for hemostasis. Patient tolerated procedure well and remained hemodynamically stable throughout. No complications were encountered and no significant blood loss. 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 ExoSeal deployed for hemostasis. Signed, Yvone Neu. Reyne Dumas, RPVI V[facility] and Interventional Radiology Specialists [facility] Radiology Electronically Signed   By: Gilmer Mor D.O.   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 The antibiotic was administered within 1 hour of the procedure ANESTHESIA/SEDATION: Moderate (conscious) sedation was employed during this procedure. A total of Versed 9.0 mg and Fentanyl 300 mcg was administered intravenously. Moderate Sedation Time: 142 minutes. The patient's level of consciousness and vital signs were monitored continuously by radiology nursing throughout the procedure under my direct supervision. CONTRAST:  Sixty-five cc FLUOROSCOPY TIME:  Fluoroscopy Time: 26 minutes 30 seconds (1859 mGy). COMPLICATIONS: None PROCEDURE: Informed consent was obtained from the patient following explanation of the procedure, risks, benefits and alternatives. The patient understands, agrees and consents for the procedure. All questions were addressed. A time out was performed prior to the initiation of the procedure. Maximal barrier sterile technique utilized including caps, mask, sterile gowns, sterile gloves, large sterile drape, hand hygiene, and Betadine prep. Ultrasound survey of the right inguinal region was performed with images stored and sent to PACs, confirming patency of the  vessel. A micropuncture needle was used access the right common femoral artery under ultrasound. With excellent arterial blood flow returned, and an .018 micro wire was passed through the needle, observed enter the abdominal aorta under fluoroscopy. The needle was removed, and a micropuncture sheath was placed over the wire. The inner dilator and wire were removed, and an 035 Bentson wire was advanced under fluoroscopy into the abdominal aorta. The sheath was removed and a standard 5 [place] vascular sheath was placed. The dilator was removed and the sheath was flushed. Cobra catheter  was advanced on the Bentson wire to the abdominal aorta and used to select the celiac artery origin. CO2 angiogram was performed. Glidewire was used to navigate the Blackey catheter into the mid segment of the splenic artery. Wire was removed and angiogram was performed. Initial strategy was to enter the saccular aneurysm for coil embolization. A microcatheter system was advanced through the base catheter, using an STC microcatheter and a 14 fathom wire. We select the largest microcoils available to our service, which were not large enough to remain within the sac of the aneurysm. The micro coils prolapsed into the native vessel. Our second strategy was then to deliver a covered stent given the diameter of the vessel, to cover the neck of the aneurysm. We elected to use a 5 mm by 5 cm covered Gore viabahn. Rose in wire was passed through the base catheter and exchange was made for a 35 cm bright tip 6 French sheath. 65 cm Kumpe the catheter was then advanced through the sheath into the splenic artery. Through the Kumpe the catheter a microcatheter system was advanced into the splenic artery beyond the aneurysm neck. Once we confirmed location with a small angiogram, an 014 transcend wire was placed. On the first attempted delivery of the covered stent, there was ultimately prolapse of the system into the abdominal aorta. Stent was  removed, microwire was removed, and exchange was made for a 6 French Brite tip 55 cm sheath. We then again selected the celiac artery with cobra catheter, with serial selection of the splenic artery and then the splenic artery beyond the aneurysm neck with a microcatheter system. The 65 cm Kumpe the catheter gained purchase within the mid segment of the splenic artery, and the longer, 55 cm Brite tip sheath was advanced into the proximal third of the splenic artery, although we could not advance around the tortuous segment. Again we placed an 0114 transcend wire beyond the neck of the aneurysm. At this point we attempted delivery of the covered stent. The covered stent would not advanced beyond the tortuosity of the mid segment of the splenic artery. The stent was removed and microcatheter was advanced on the transcend wire. We then exchanged for a 014 BMW wire. Another attempt was made to pass the stent. The stent would not pass around the tortuous segment. We then elected to coil embolize the splenic artery in the branches beyond the aneurysm neck and proximal to the aneurysm neck, essentially proximal splenic artery embolization. Once the stent was removed from the system, a penumbra lantern microcatheter was advanced on the BMW wire and the BMW wire was removed. 014 fathom wire was used then to select the downstream artery from the aneurysm neck. A 4 mm penumbra occlusive device was placed. Catheter was withdrawn. We then used the dome of the aneurysm to deflect into the second downstream artery. A 4 mm penumbra occlusive device was placed. Catheter was withdrawn into the more proximal splenic artery. A 6 mm penumbra occlusive device was placed. Additional Ruby coils placed on the proximal aspect of the POD. Final angiogram was performed confirming no flow through the segment. All catheters wires were removed and Exoseal was placed for hemostasis. Patient tolerated procedure well and remained hemodynamically  stable throughout. No complications were encountered and no significant blood loss. 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 ExoSeal deployed for hemostasis. Signed, Marijean Niemann  Kenna Gilbert, DO, RPVI Vascular and Interventional Radiology Specialists [facility] Radiology Electronically Signed   By: Gilmer Mor D.O.   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  We will sign off    FEN - HH diet VTE -on hold due to above, SCDs  ID -none currently needed   LOS: 3 days    Letha Cape , [facility] Surgery 02/10/2020, 10:10 AM Please see Amion for pager number during day hours 7:00am-4:30pm or 7:00am -11:30am on weekends

## 2020-02-10 NOTE — Progress Notes (Signed)
Discharge instructions reviewed with Patient utilizing teach back method. Patient being discharged to home.

## 2020-02-10 NOTE — Discharge Summary (Signed)
Discharge Summary  Robert Mullen WUJ:[id number] DOB: June 20, 1950  PCP: Vivien Presto, MD  Admit date: 02/07/2020 Discharge date: 02/10/2020  Time spent: 35 minutes   Recommendations for Outpatient Follow-up:  1. Follow up with your PCP 2. Please obtain 23-valent pneumococcal polysaccharide vaccine on 04/11/20 at your PCP's office. 3. Please follow up with interventional radiology 4. Please follow-up with general surgery. 5. Take your medications as prescribed  Discharge Diagnoses:  Active Hospital Problems   Diagnosis Date Noted  . 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 Nontraumatic hemoperitoneum 02/07/2020  . Hemoperitoneum 02/07/2020  . Acute kidney injury (nontraumatic) (HCC) 02/07/2020    Resolved Hospital Problems  No resolved problems to display.    Discharge Condition: Stable  Diet recommendation: Resume previous diet.  Vitals:   02/10/20 1200 02/10/20 1326  BP:  127/81  Pulse:  78  Resp:  20  Temp: 98.8 F (37.1 C)   SpO2:  97%    History of present illness:  Brief Narrative: 69 year old male with no significant past medical history admitted with severe abdominal pain found to have bleeding splenic artery aneurysm now status post splenic artery embolization by interventional radiology on 02/08/2020.  He was also found to have AKI which has resolved. 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   Post embolization of splenic artery aneurysm on 02/08/2020 by interventional radiology, Dr. Loreta Ave.  02/10/20:  Seen and examined at his bedside.  Reports some left upper abdominal pain whic is expected per general surgery post embolization.  Tolerating a diet well.  Hg stable and up-trending 9.8 from 8.2.  No overt bleeding.    Hospital Course:  Principal Problem:   Splenic artery aneurysm [facility]) Active Problems:    Nontraumatic hemoperitoneum   Acute kidney injury (nontraumatic) (HCC)   Hemoperitoneum  #1 Splenic artery aneurysm post embolization- Patient admitted with severe abdominal pain.  CT scan with findings worrisome for hemoperitoneum.   General surgery and IR consulted. He is status post splenic artery aneurysm embolization on 02/08/2020 by Dr. Loreta Ave, interventional radiology.  No overt bleeding His hemoglobin this morning is 9.8 from 8.2 from 8.6 down from 9.0 down from 9.4.  Ordered PCV13, meningococcus, and Haemophilus influenza vaccines to be administered prior to discharge. Advised to follow up with his PCP for PPSV23 in > 2 months, from 04/11/20.  Patient understands and agrees with plan.  #2 Resolved AKI likely prerenal  Resolved with IV hydration.   Creatinine 0.97 from 1.5.  Avoid nephrotoxins  #3 Resolved acute blood loss anemia  Hg up-trending 9.8 from 8.2 No overt bleeding Repeat CBC on 03/17/20 at PCP's office  #4 history of peptic ulcer disease- Not on any medications at home Started on Protonix during the hospital stay 40 mg daily Follow up with your PCP.  Estimated body mass index is 23.53 kg/m as calculated from the following:   Height as of this encounter: 6' (1.829 m).   Weight as of this encounter: 78.7 kg.  DVT prophylaxis: SCD  code Status: Full code    Consultants: Interventional radiology, general surgery  Procedures: Post embolization of splenic artery aneurysm on 02/08/2020 by interventional radiology, Dr. Loreta Ave.   Discharge Exam: BP 127/81 (BP Location: Left Arm)   Pulse 78   Temp 98.8 F (37.1 C) (Oral)   Resp 20   Ht 6' (1.829 m)   Wt 78.7  kg   SpO2 97%   BMI 23.53 kg/m  . General: 69 y.o. year-old male well developed well nourished in no acute distress.  Alert and oriented x3. . Cardiovascular: Regular rate and rhythm with no rubs or gallops.  No thyromegaly or JVD noted.   Marland Kitchen Respiratory: Clear to auscultation with no wheezes  or rales. Good inspiratory effort. . Abdomen: Soft mild tenderness left upper quadrant abdomen with normal bowel sounds x4 quadrants. . Musculoskeletal: No lower extremity edema. 2/4 pulses in all 4 extremities. Marland Kitchen Psychiatry: Mood is appropriate for condition and setting  Discharge Instructions You were cared for by a hospitalist during your hospital stay. If you have any questions about your discharge medications or the care you received while you were in the hospital after you are discharged, you can call the unit and asked to speak with the hospitalist on call if the hospitalist that took care of you is not available. Once you are discharged, your primary care physician will handle any further medical issues. Please note that NO REFILLS for any discharge medications will be authorized once you are discharged, as it is imperative that you return to your primary care physician (or establish a relationship with a primary care physician if you do not have one) for your aftercare needs so that they can reassess your need for medications and monitor your lab values.   Allergies as of 02/10/2020      Reactions   Amoxicillin-pot Clavulanate    Other reaction(s): Abdominal Pain      Medication List    TAKE these medications   oxyCODONE 5 MG immediate release tablet Commonly known as: Oxy IR/ROXICODONE Take 1 tablet (5 mg total) by mouth 3 (three) times daily as needed for up to 5 days for moderate pain.   pantoprazole 40 MG tablet Commonly known as: PROTONIX Take 1 tablet (40 mg total) by mouth daily. Start taking on: February 11, 2020   polyethylene glycol 17 g packet Commonly known as: MiraLax Take 17 g by mouth daily.   pravastatin 40 MG tablet Commonly known as: PRAVACHOL Take 1 tablet by mouth daily.      Allergies  Allergen Reactions  . Amoxicillin-Pot Clavulanate     Other reaction(s): Abdominal Pain    Follow-up Information    Corrington, Kip A, MD. Call in 1 day(s).     Specialty: Family Medicine Why: Please call for a post hospital follow up appointment. Contact information: [address] [place] [address] [phone]        Gilmer Mor, DO. Call in 1 day(s).   Specialties: Interventional Radiology, Radiology Why: Please call for a post hospital follow up appointment. Contact information: [address] [place] [address] [phone]        Luretha Murphy, MD. Call in 1 day(s).   Specialty: General Surgery Why: Please call for a post hospital follow up appointment. Contact information: [address] [place] [address] [phone]                The results of significant diagnostics from this hospitalization (including imaging, microbiology, ancillary and laboratory) are listed below for reference.    Significant Diagnostic Studies: CT ABDOMEN PELVIS WO CONTRAST  Result Date: 02/07/2020 CLINICAL DATA:  GI bleed. Abdominal pain and cramping for 24 hours. Abnormal previous CT scan. EXAM: CT ABDOMEN AND PELVIS WITHOUT CONTRAST TECHNIQUE: Multidetector CT imaging of the abdomen and pelvis was performed following the  standard protocol without IV contrast. COMPARISON:  Earlier CT scan, same date. FINDINGS: Lower chest: Streaky bibasilar atelectasis noted. No pleural effusions or pulmonary infiltrates. Aortic and coronary artery calcifications are noted. Hepatobiliary: No hepatic lesions are identified without contrast. Stable high attenuation perihepatic fluid. The gallbladder is normal. No common bile duct dilatation. Pancreas: No mass, inflammation or ductal dilatation. Spleen: Normal size.  Stable high attenuation perihepatic fluid. Adrenals/Urinary Tract: The adrenal glands are normal and stable. Small renal calculi but no obstructing ureteral calculi or bladder calculi. No worrisome renal or bladder lesions. Small bladder diverticulum noted. Stomach/Bowel: The stomach, duodenum, small bowel and  colon are unremarkable. No acute inflammatory changes, mass lesions or obstructive findings. No leaking oral contrast is identified. Significant sigmoid colon diverticulosis but no findings for acute diverticulitis. Vascular/Lymphatic: The aorta is normal in caliber. Mild tortuosity and scattered atherosclerotic calcifications. No periaortic fluid collections. No mesenteric or retroperitoneal mass or adenopathy. Reproductive: The prostate gland and seminal vesicles are unremarkable. Other: Persistent but stable high attenuation fluid throughout the abdomen pelvis worrisome for hemoperitoneum. 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 Could not exclude the possibility that this is leaking. Recommend vascular surgery consultation. Musculoskeletal: No significant bony findings. IMPRESSION: 1. Persistent but stable high attenuation fluid throughout the abdomen pelvis worrisome for hemoperitoneum. 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 Could not exclude the possibility that this is leaking. Recommend vascular surgery consultation. 2. Sigmoid colon diverticulosis without findings for acute diverticulitis. 3. Small renal calculi but no obstructing ureteral calculi or bladder calculi. Electronically Signed   By: Rudie Meyer M.D.   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 The antibiotic was administered within 1 hour of the procedure ANESTHESIA/SEDATION: Moderate (conscious) sedation was employed during  this procedure. A total of Versed 9.0 mg and Fentanyl 300 mcg was administered intravenously. Moderate Sedation Time: 142 minutes. The patient's level of consciousness and vital signs were monitored continuously by radiology nursing throughout the procedure under my direct supervision. CONTRAST:  Sixty-five cc FLUOROSCOPY TIME:  Fluoroscopy Time: 26 minutes 30 seconds (1859 mGy). COMPLICATIONS: None PROCEDURE: Informed consent was obtained from the patient following explanation of the procedure, risks, benefits and alternatives. The patient understands, agrees and consents for the procedure. All questions were addressed. A time out was performed prior to the initiation of the procedure. Maximal barrier sterile technique utilized including caps, mask, sterile gowns, sterile gloves, large sterile drape, hand hygiene, and Betadine prep. Ultrasound survey of the right inguinal region was performed with images stored and sent to PACs, confirming patency of the vessel. A micropuncture needle was used access the right common femoral artery under ultrasound. With excellent arterial blood flow returned, and an .018 micro wire was passed through the needle, observed enter the abdominal aorta under fluoroscopy. The needle was removed, and a micropuncture sheath was placed over the wire. The inner dilator and wire were removed, and an 035 Bentson wire was advanced under fluoroscopy into the abdominal aorta. The sheath was removed and a standard 5 [place] vascular sheath was placed. The dilator was removed and  the sheath was flushed. Cobra catheter was advanced on the Bentson wire to the abdominal aorta and used to select the celiac artery origin. CO2 angiogram was performed. Glidewire was used to navigate the [place] catheter into the mid segment of the splenic artery. Wire was removed and angiogram was performed. Initial strategy was to enter the saccular aneurysm for coil embolization. A microcatheter system was advanced through  the base catheter, using an STC microcatheter and a 14 fathom wire. We select the largest microcoils available to our service, which were not large enough to remain within the sac of the aneurysm. The micro coils prolapsed into the native vessel. Our second strategy was then to deliver a covered stent given the diameter of the vessel, to cover the neck of the aneurysm. We elected to use a 5 mm by 5 cm covered Gore viabahn. Rose in wire was passed through the base catheter and exchange was made for a 35 cm bright tip 6 French sheath. 65 cm Kumpe the catheter was then advanced through the sheath into the splenic artery. Through the Kumpe the catheter a microcatheter system was advanced into the splenic artery beyond the aneurysm neck. Once we confirmed location with a small angiogram, an 014 transcend wire was placed. On the first attempted delivery of the covered stent, there was ultimately prolapse of the system into the abdominal aorta. Stent was removed, microwire was removed, and exchange was made for a 6 French Brite tip 55 cm sheath. We then again selected the celiac artery with cobra catheter, with serial selection of the splenic artery and then the splenic artery beyond the aneurysm neck with a microcatheter system. The 65 cm Kumpe the catheter gained purchase within the mid segment of the splenic artery, and the longer, 55 cm Brite tip sheath was advanced into the proximal third of the splenic artery, although we could not advance around the tortuous segment. Again we placed an 0114 transcend wire beyond the neck of the aneurysm. At this point we attempted delivery of the covered stent. The covered stent would not advanced beyond the tortuosity of the mid segment of the splenic artery. The stent was removed and microcatheter was advanced on the transcend wire. We then exchanged for a 014 BMW wire. Another attempt was made to pass the stent. The stent would not pass around the tortuous segment. We then  elected to coil embolize the splenic artery in the branches beyond the aneurysm neck and proximal to the aneurysm neck, essentially proximal splenic artery embolization. Once the stent was removed from the system, a penumbra lantern microcatheter was advanced on the BMW wire and the BMW wire was removed. 014 fathom wire was used then to select the downstream artery from the aneurysm neck. A 4 mm penumbra occlusive device was placed. Catheter was withdrawn. We then used the dome of the aneurysm to deflect into the second downstream artery. A 4 mm penumbra occlusive device was placed. Catheter was withdrawn into the more proximal splenic artery. A 6 mm penumbra occlusive device was placed. Additional Ruby coils placed on the proximal aspect of the POD. Final angiogram was performed confirming no flow through the segment. All catheters wires were removed and Exoseal was placed for hemostasis. Patient tolerated procedure well and remained hemodynamically stable throughout. No complications were encountered and no significant blood loss. 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  ExoSeal deployed for hemostasis. Signed, Yvone NeuJaime S. Reyne DumasWagner, DO, RPVI [facility] Electronically Signed   By: Gilmer MorJaime  Wagner D.O.   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 The antibiotic was administered within 1 hour of the  procedure ANESTHESIA/SEDATION: Moderate (conscious) sedation was employed during this procedure. A total of Versed 9.0 mg and Fentanyl 300 mcg was administered intravenously. Moderate Sedation Time: 142 minutes. The patient's level of consciousness and vital signs were monitored continuously by radiology nursing throughout the procedure under my direct supervision. CONTRAST:  Sixty-five cc FLUOROSCOPY TIME:  Fluoroscopy Time: 26 minutes 30 seconds (1859 mGy). COMPLICATIONS: None PROCEDURE: Informed consent was obtained from the patient following explanation of the procedure, risks, benefits and alternatives. The patient understands, agrees and consents for the procedure. All questions were addressed. A time out was performed prior to the initiation of the procedure. Maximal barrier sterile technique utilized including caps, mask, sterile gowns, sterile gloves, large sterile drape, hand hygiene, and Betadine prep. Ultrasound survey of the right inguinal region was performed with images stored and sent to PACs, confirming patency of the vessel. A micropuncture needle was used access the right common femoral artery under ultrasound. With excellent arterial blood flow returned, and an .018 micro wire was passed through the needle, observed enter the abdominal aorta under fluoroscopy. The needle was removed, and a micropuncture sheath was placed over the wire. The inner dilator and wire were removed, and an 035 Bentson wire was advanced under fluoroscopy into the abdominal aorta. The sheath was removed and a standard 5 [place] vascular sheath was placed. The dilator was removed and the sheath was flushed. Cobra catheter was advanced on the Bentson wire to the abdominal aorta and used to select the celiac artery origin. CO2 angiogram was performed. Glidewire was used to navigate the [place] catheter into the mid segment of the splenic artery. Wire was removed and angiogram was performed. Initial strategy was to enter the  saccular aneurysm for coil embolization. A microcatheter system was advanced through the base catheter, using an STC microcatheter and a 14 fathom wire. We select the largest microcoils available to our service, which were not large enough to remain within the sac of the aneurysm. The micro coils prolapsed into the native vessel. Our second strategy was then to deliver a covered stent given the diameter of the vessel, to cover the neck of the aneurysm. We elected to use a 5 mm by 5 cm covered Gore viabahn. Rose in wire was passed through the base catheter and exchange was made for a 35 cm bright tip 6 French sheath. 65 cm Kumpe the catheter was then advanced through the sheath into the splenic artery. Through the Kumpe the catheter a microcatheter system was advanced into the splenic artery beyond the aneurysm neck. Once we confirmed location with a small angiogram, an 014 transcend wire was placed. On the first attempted delivery of the covered stent, there was ultimately prolapse of the system into the abdominal aorta. Stent was removed, microwire was removed, and exchange was made for a 6 French Brite tip 55 cm sheath. We then again selected the celiac artery with cobra catheter,  with serial selection of the splenic artery and then the splenic artery beyond the aneurysm neck with a microcatheter system. The 65 cm Kumpe the catheter gained purchase within the mid segment of the splenic artery, and the longer, 55 cm Brite tip sheath was advanced into the proximal third of the splenic artery, although we could not advance around the tortuous segment. Again we placed an 0114 transcend wire beyond the neck of the aneurysm. At this point we attempted delivery of the covered stent. The covered stent would not advanced beyond the tortuosity of the mid segment of the splenic artery. The stent was removed and microcatheter was advanced on the transcend wire. We then exchanged for a 014 BMW wire. Another attempt was made to  pass the stent. The stent would not pass around the tortuous segment. We then elected to coil embolize the splenic artery in the branches beyond the aneurysm neck and proximal to the aneurysm neck, essentially proximal splenic artery embolization. Once the stent was removed from the system, a penumbra lantern microcatheter was advanced on the BMW wire and the BMW wire was removed. 014 fathom wire was used then to select the downstream artery from the aneurysm neck. A 4 mm penumbra occlusive device was placed. Catheter was withdrawn. We then used the dome of the aneurysm to deflect into the second downstream artery. A 4 mm penumbra occlusive device was placed. Catheter was withdrawn into the more proximal splenic artery. A 6 mm penumbra occlusive device was placed. Additional Ruby coils placed on the proximal aspect of the POD. Final angiogram was performed confirming no flow through the segment. All catheters wires were removed and Exoseal was placed for hemostasis. Patient tolerated procedure well and remained hemodynamically stable throughout. No complications were encountered and no significant blood loss. 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 ExoSeal deployed for hemostasis. Signed, Yvone Neu. Reyne Dumas, RPVI [facility] Electronically Signed   By: Gilmer Mor D.O.   On: 02/09/2020 09:20   IR Angiogram Selective Each Additional Vessel  Result Date: 02/09/2020 INDICATION: 69 year old male with ruptured visceral artery aneurysm, splenic artery, presents for angiogram and possible embolization EXAM: ULTRASOUND-GUIDED ACCESS RIGHT COMMON FEMORAL ARTERY MESENTERIC ANGIOGRAM TREATMENT OF RUPTURED VISCERAL ARTERY ANEURYSM FROM THE SPLENIC ARTERY WITH PROXIMAL COIL EMBOLIZATION OF THE SPLENIC ARTERY  EXOSEAL MEDICATIONS: None. The antibiotic was administered within 1 hour of the procedure ANESTHESIA/SEDATION: Moderate (conscious) sedation was employed during this procedure. A total of Versed 9.0 mg and Fentanyl 300 mcg was administered intravenously. Moderate Sedation Time: 142 minutes. The patient's level of consciousness and vital signs were monitored continuously by radiology nursing throughout the procedure under my direct supervision. CONTRAST:  Sixty-five cc FLUOROSCOPY TIME:  Fluoroscopy Time: 26 minutes 30 seconds (1859 mGy). COMPLICATIONS: None PROCEDURE: Informed consent was obtained from the patient following explanation of the procedure, risks, benefits and alternatives. The patient understands, agrees and consents for the procedure. All questions were addressed. A time out was performed prior to the initiation of the procedure. Maximal barrier sterile technique utilized including caps, mask, sterile gowns, sterile gloves, large sterile drape, hand hygiene, and Betadine prep. Ultrasound survey of the right inguinal region was performed with images stored and sent to PACs, confirming patency of the vessel. A micropuncture needle was used access the right common femoral artery under ultrasound. With excellent arterial blood  flow returned, and an .018 micro wire was passed through the needle, observed enter the abdominal aorta under fluoroscopy. The needle was removed, and a micropuncture sheath was placed over the wire. The inner dilator and wire were removed, and an 035 Bentson wire was advanced under fluoroscopy into the abdominal aorta. The sheath was removed and a standard 5 [place] vascular sheath was placed. The dilator was removed and the sheath was flushed. Cobra catheter was advanced on the Bentson wire to the abdominal aorta and used to select the celiac artery origin. CO2 angiogram was performed. Glidewire was used to navigate the [place] catheter into the mid segment of the splenic artery.  Wire was removed and angiogram was performed. Initial strategy was to enter the saccular aneurysm for coil embolization. A microcatheter system was advanced through the base catheter, using an STC microcatheter and a 14 fathom wire. We select the largest microcoils available to our service, which were not large enough to remain within the sac of the aneurysm. The micro coils prolapsed into the native vessel. Our second strategy was then to deliver a covered stent given the diameter of the vessel, to cover the neck of the aneurysm. We elected to use a 5 mm by 5 cm covered Gore viabahn. Rose in wire was passed through the base catheter and exchange was made for a 35 cm bright tip 6 French sheath. 65 cm Kumpe the catheter was then advanced through the sheath into the splenic artery. Through the Kumpe the catheter a microcatheter system was advanced into the splenic artery beyond the aneurysm neck. Once we confirmed location with a small angiogram, an 014 transcend wire was placed. On the first attempted delivery of the covered stent, there was ultimately prolapse of the system into the abdominal aorta. Stent was removed, microwire was removed, and exchange was made for a 6 French Brite tip 55 cm sheath. We then again selected the celiac artery with cobra catheter, with serial selection of the splenic artery and then the splenic artery beyond the aneurysm neck with a microcatheter system. The 65 cm Kumpe the catheter gained purchase within the mid segment of the splenic artery, and the longer, 55 cm Brite tip sheath was advanced into the proximal third of the splenic artery, although we could not advance around the tortuous segment. Again we placed an 0114 transcend wire beyond the neck of the aneurysm. At this point we attempted delivery of the covered stent. The covered stent would not advanced beyond the tortuosity of the mid segment of the splenic artery. The stent was removed and microcatheter was advanced on the  transcend wire. We then exchanged for a 014 BMW wire. Another attempt was made to pass the stent. The stent would not pass around the tortuous segment. We then elected to coil embolize the splenic artery in the branches beyond the aneurysm neck and proximal to the aneurysm neck, essentially proximal splenic artery embolization. Once the stent was removed from the system, a penumbra lantern microcatheter was advanced on the BMW wire and the BMW wire was removed. 014 fathom wire was used then to select the downstream artery from the aneurysm neck. A 4 mm penumbra occlusive device was placed. Catheter was withdrawn. We then used the dome of the aneurysm to deflect into the second downstream artery. A 4 mm penumbra occlusive device was placed. Catheter was withdrawn into the more proximal splenic artery. A 6 mm penumbra occlusive device was placed. Additional Ruby coils placed on the proximal  aspect of the POD. Final angiogram was performed confirming no flow through the segment. All catheters wires were removed and Exoseal was placed for hemostasis. Patient tolerated procedure well and remained hemodynamically stable throughout. No complications were encountered and no significant blood loss. 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 ExoSeal deployed for hemostasis. Signed, Yvone Neu. Reyne Dumas, RPVI [facility] Radiology Electronically Signed   By: Gilmer Mor D.O.   On: 02/09/2020 09:20   US RENAL  Result Date: 02/08/2020 CLINICAL DATA:  69 year old male with acute renal insufficiency. Suspected hemoperitoneum on recent CT Abdomen and Pelvis. EXAM: RENAL / URINARY TRACT ULTRASOUND COMPLETE COMPARISON:  CT Abdomen and Pelvis 02/07/2020. FINDINGS: Right Kidney: Renal measurements: 11.9 x 5.4 x 5.9 cm = volume: 198  mL. Echogenicity at the upper limits of normal (image 4). No mass or hydronephrosis visualized. Left Kidney: Renal measurements: 13.2 x 6.3 x 6.3 cm = volume: 273 mL. Echogenicity at the upper limits of normal (image 15). No mass or hydronephrosis visualized. Bladder: Appears normal for degree of bladder distention. Other: Free fluid in the both upper quadrants although visible fluid appears fairly simple (images 4 and 13). IMPRESSION: 1. No acute renal finding. Borderline renal cortical echogenicity raising the possibility of some chronic medical renal disease. 2. Small volume of free fluid redemonstrated in both upper quadrants. Electronically Signed   By: Odessa Fleming M.D.   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 The antibiotic was administered within 1 hour of the procedure ANESTHESIA/SEDATION: Moderate (conscious) sedation was employed during this procedure. A total of Versed 9.0 mg and Fentanyl 300 mcg was administered intravenously. Moderate Sedation Time: 142 minutes. The patient's level of consciousness and vital signs were monitored continuously by radiology nursing throughout the procedure under my direct supervision. CONTRAST:  Sixty-five cc FLUOROSCOPY TIME:  Fluoroscopy Time: 26 minutes 30 seconds (1859 mGy). COMPLICATIONS: None PROCEDURE: Informed consent was obtained from the patient following explanation of the procedure, risks, benefits and alternatives. The patient understands, agrees and consents for the procedure. All questions were addressed. A time out was performed prior to the initiation of the  procedure. Maximal barrier sterile technique utilized including caps, mask, sterile gowns, sterile gloves, large sterile drape, hand hygiene, and Betadine prep. Ultrasound survey of the right inguinal region was performed with images stored and sent to PACs, confirming patency of the vessel. A micropuncture needle was used access the right common femoral artery under ultrasound. With excellent arterial blood flow returned, and an .018 micro wire was passed through the needle, observed enter the abdominal aorta under fluoroscopy. The needle was removed, and a micropuncture sheath was placed over the wire. The inner dilator and wire were removed, and an 035 Bentson wire was advanced under fluoroscopy into the abdominal aorta. The sheath was removed and a standard 5 [place] vascular sheath was placed. The dilator was removed and the sheath was flushed. Cobra catheter was advanced on the Bentson wire to the abdominal aorta and used to select the celiac artery origin. CO2 angiogram was performed.  Glidewire was used to navigate the [place] catheter into the mid segment of the splenic artery. Wire was removed and angiogram was performed. Initial strategy was to enter the saccular aneurysm for coil embolization. A microcatheter system was advanced through the base catheter, using an STC microcatheter and a 14 fathom wire. We select the largest microcoils available to our service, which were not large enough to remain within the sac of the aneurysm. The micro coils prolapsed into the native vessel. Our second strategy was then to deliver a covered stent given the diameter of the vessel, to cover the neck of the aneurysm. We elected to use a 5 mm by 5 cm covered [place] viabahn. Rose in wire was passed through the base catheter and exchange was made for a 35 cm bright tip 6 French sheath. 65 cm Kumpe the catheter was then advanced through the sheath into the splenic artery. Through the Kumpe the catheter a microcatheter system was  advanced into the splenic artery beyond the aneurysm neck. Once we confirmed location with a small angiogram, an 014 transcend wire was placed. On the first attempted delivery of the covered stent, there was ultimately prolapse of the system into the abdominal aorta. Stent was removed, microwire was removed, and exchange was made for a 6 French Brite tip 55 cm sheath. We then again selected the celiac artery with cobra catheter, with serial selection of the splenic artery and then the splenic artery beyond the aneurysm neck with a microcatheter system. The 65 cm Kumpe the catheter gained purchase within the mid segment of the splenic artery, and the longer, 55 cm Brite tip sheath was advanced into the proximal third of the splenic artery, although we could not advance around the tortuous segment. Again we placed an 0114 transcend wire beyond the neck of the aneurysm. At this point we attempted delivery of the covered stent. The covered stent would not advanced beyond the tortuosity of the mid segment of the splenic artery. The stent was removed and microcatheter was advanced on the transcend wire. We then exchanged for a 014 BMW wire. Another attempt was made to pass the stent. The stent would not pass around the tortuous segment. We then elected to coil embolize the splenic artery in the branches beyond the aneurysm neck and proximal to the aneurysm neck, essentially proximal splenic artery embolization. Once the stent was removed from the system, a penumbra lantern microcatheter was advanced on the BMW wire and the BMW wire was removed. 014 fathom wire was used then to select the downstream artery from the aneurysm neck. A 4 mm penumbra occlusive device was placed. Catheter was withdrawn. We then used the dome of the aneurysm to deflect into the second downstream artery. A 4 mm penumbra occlusive device was placed. Catheter was withdrawn into the more proximal splenic artery. A 6 mm penumbra occlusive device was  placed. Additional Ruby coils placed on the proximal aspect of the POD. Final angiogram was performed confirming no flow through the segment. All catheters wires were removed and Exoseal was placed for hemostasis. Patient tolerated procedure well and remained hemodynamically stable throughout. No complications were encountered and no significant blood loss. 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 ExoSeal deployed for hemostasis. Signed, Yvone Neu. Reyne Dumas, RPVI [facility] Radiology Electronically Signed   By: Gilmer Mor D.O.  On: 02/09/2020 09:20   CT Renal Stone Study  Result Date: 02/07/2020 CLINICAL DATA:  Flank pain.  Abdominal pain. EXAM: CT ABDOMEN AND PELVIS WITHOUT CONTRAST TECHNIQUE: Multidetector CT imaging of the abdomen and pelvis was performed following the standard protocol without IV contrast. COMPARISON:  None. FINDINGS: Lower chest: Hazy airspace opacities in bilateral lower lobes. Additional areas of dependent atelectasis. Hepatobiliary: No focal liver abnormality is seen. No gallstones, gallbladder wall thickening, or biliary dilatation. Pancreas: Unremarkable. No pancreatic ductal dilatation or surrounding inflammatory changes. Spleen: Normal in size without focal abnormality. Adrenals/Urinary Tract: Adrenal glands are unremarkable. Kidneys are normal, without renal calculi, focal lesion, or hydronephrosis. Small right peritrigonal bladder diverticulum. Stomach/Bowel: Stomach is within normal limits. The appendix is not seen. No evidence of bowel wall thickening, distention, or inflammatory changes. Diffuse left colonic diverticulosis without evidence of acute diverticulitis. Vascular/Lymphatic: Aortic atherosclerosis. No enlarged abdominal or pelvic lymph nodes. Reproductive: Mild  enlargement of the prostate gland with coarse calcifications. Other: Moderate amount of high density fluid is seen within the abdomen and pelvis. There is a rim calcified 2.1 cm structure adjacent to the tail of the pancreas with surrounding fat stranding, which may represent calcified aneurysm or pseudoaneurysm. Musculoskeletal: No acute or significant osseous findings. IMPRESSION: 1. Moderate amount of high density fluid within the abdomen and pelvis, concerning for hemoperitoneum. Alternatively this may represent a ruptured viscus, however the lack of free gas argues against that. A third consideration would be a radio occult acute pancreatitis. CT angiography, conventional angiography and/or surgical consult is recommended. 2. 2.1 cm rim calcified structure adjacent to the tail of the pancreas with surrounding fat stranding, which may represent calcified vascular aneurysm or pseudoaneurysm. 3. Diffuse left colonic diverticulosis without evidence of acute diverticulitis. 4. Small right peritrigonal bladder diverticulum. 5. Mild enlargement of the prostate gland with coarse calcifications. Please correlate to serum PSA values. 6. Hazy airspace opacities in the dependent portions of the bilateral lower lobes may represent atelectasis or early airspace consolidation. 7. Aortic atherosclerosis. These results were called by telephone at the time of interpretation on 02/07/2020 at 2:07 pm to provider [facility] , who verbally acknowledged these results. Aortic Atherosclerosis (ICD10-I70.0). Electronically Signed   By: Ted Mcalpine M.D.   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 The antibiotic was administered within 1 hour of the procedure ANESTHESIA/SEDATION: Moderate (conscious) sedation was employed during this procedure. A total of Versed 9.0 mg and Fentanyl 300 mcg was administered intravenously. Moderate Sedation Time: 142 minutes. The patient's level of consciousness and vital signs were monitored continuously by radiology nursing throughout the procedure under my direct supervision. CONTRAST:  Sixty-five cc FLUOROSCOPY TIME:  Fluoroscopy Time: 26 minutes 30 seconds (1859 mGy). COMPLICATIONS: None PROCEDURE: Informed consent was obtained from the patient following explanation of the procedure, risks, benefits and alternatives. The patient understands, agrees and consents for the procedure. All questions were addressed. A time out was performed prior to the initiation of the procedure. Maximal barrier sterile technique utilized including caps, mask, sterile gowns, sterile gloves, large sterile drape, hand hygiene, and Betadine prep. Ultrasound survey of the right inguinal region was performed  with images stored and sent to PACs, confirming patency of the vessel. A micropuncture needle was used access the right common femoral artery under ultrasound. With excellent arterial blood flow returned, and an .018 micro wire was passed through the needle, observed enter the abdominal aorta under fluoroscopy. The needle was removed, and a micropuncture sheath was placed over the wire. The inner dilator and wire were removed, and an 035 Bentson wire was advanced under fluoroscopy into the abdominal aorta. The sheath was removed and a standard 5 [place] vascular sheath was placed. The dilator was removed and the sheath was flushed. Cobra catheter was advanced on the Bentson wire to the abdominal aorta and used to select the celiac artery origin. CO2 angiogram was performed. Glidewire was used to  navigate the [place] catheter into the mid segment of the splenic artery. Wire was removed and angiogram was performed. Initial strategy was to enter the saccular aneurysm for coil embolization. A microcatheter system was advanced through the base catheter, using an STC microcatheter and a 14 fathom wire. We select the largest microcoils available to our service, which were not large enough to remain within the sac of the aneurysm. The micro coils prolapsed into the native vessel. Our second strategy was then to deliver a covered stent given the diameter of the vessel, to cover the neck of the aneurysm. We elected to use a 5 mm by 5 cm covered [place] viabahn. Rose in wire was passed through the base catheter and exchange was made for a 35 cm bright tip 6 French sheath. 65 cm Kumpe the catheter was then advanced through the sheath into the splenic artery. Through the Kumpe the catheter a microcatheter system was advanced into the splenic artery beyond the aneurysm neck. Once we confirmed location with a small angiogram, an 014 transcend wire was placed. On the first attempted delivery of the covered stent, there was ultimately prolapse of the system into the abdominal aorta. Stent was removed, microwire was removed, and exchange was made for a 6 French Brite tip 55 cm sheath. We then again selected the celiac artery with cobra catheter, with serial selection of the splenic artery and then the splenic artery beyond the aneurysm neck with a microcatheter system. The 65 cm Kumpe the catheter gained purchase within the mid segment of the splenic artery, and the longer, 55 cm Brite tip sheath was advanced into the proximal third of the splenic artery, although we could not advance around the tortuous segment. Again we placed an 0114 transcend wire beyond the neck of the aneurysm. At this point we attempted delivery of the covered stent. The covered stent would not advanced beyond the tortuosity of the mid segment of the  splenic artery. The stent was removed and microcatheter was advanced on the transcend wire. We then exchanged for a 014 BMW wire. Another attempt was made to pass the stent. The stent would not pass around the tortuous segment. We then elected to coil embolize the splenic artery in the branches beyond the aneurysm neck and proximal to the aneurysm neck, essentially proximal splenic artery embolization. Once the stent was removed from the system, a penumbra lantern microcatheter was advanced on the BMW wire and the BMW wire was removed. 014 fathom wire was used then to select the downstream artery from the aneurysm neck. A 4 mm penumbra occlusive device was placed. Catheter was withdrawn. We then used the dome of the aneurysm to deflect into the second downstream artery. A 4 mm  penumbra occlusive device was placed. Catheter was withdrawn into the more proximal splenic artery. A 6 mm penumbra occlusive device was placed. Additional Ruby coils placed on the proximal aspect of the POD. Final angiogram was performed confirming no flow through the segment. All catheters wires were removed and Exoseal was placed for hemostasis. Patient tolerated procedure well and remained hemodynamically stable throughout. No complications were encountered and no significant blood loss. 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 ExoSeal deployed for hemostasis. Signed, Yvone Neu. Reyne Dumas, RPVI [facility] Radiology Special[facility] Radiology Electronically Signed   By: Gilmer Mor D.O.   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 The antibiotic was administered within 1 hour of the procedure ANESTHESIA/SEDATION: Moderate (conscious) sedation was employed during this procedure. A total of Versed 9.0 mg and Fentanyl 300 mcg was administered intravenously. Moderate Sedation Time: 142 minutes. The patient's level of consciousness and vital signs were monitored continuously by radiology nursing throughout the procedure under my direct supervision. CONTRAST:  Sixty-five cc FLUOROSCOPY TIME:  Fluoroscopy Time: 26 minutes 30 seconds (1859 mGy). COMPLICATIONS: None PROCEDURE: Informed consent was obtained from the patient following explanation of the procedure, risks, benefits and alternatives. The patient understands, agrees and consents for the procedure. All questions were addressed. A time out was performed prior to the initiation of the procedure. Maximal barrier sterile technique utilized including caps, mask, sterile gowns, sterile gloves, large sterile drape, hand hygiene, and Betadine prep. Ultrasound survey of the right inguinal region was performed with images stored and sent to PACs, confirming patency of the vessel. A micropuncture needle was used access the right common femoral artery under ultrasound. With excellent arterial blood flow returned, and an .018 micro wire was passed through the needle, observed enter the abdominal aorta under fluoroscopy. The needle was removed, and a micropuncture sheath was placed over the wire. The inner dilator and wire were removed, and an 035 Bentson wire was advanced under fluoroscopy into the abdominal aorta. The sheath was removed and a standard 5 [place] vascular sheath was placed. The dilator was removed and the sheath was flushed. Cobra catheter was advanced on the Bentson wire to the abdominal aorta and  used to select the celiac artery origin. CO2 angiogram was performed. Glidewire was used to navigate the [place] catheter into the mid segment of the splenic artery. Wire was removed and angiogram was performed. Initial strategy was to enter the saccular aneurysm for coil embolization. A microcatheter system was advanced through the base catheter, using an STC microcatheter and a 14 fathom wire. We select the largest microcoils available to our service, which were not large enough to remain within the sac of the aneurysm. The micro coils prolapsed into the native vessel. Our second strategy was then to deliver a covered stent given the diameter of the vessel, to cover the neck of the aneurysm. We elected to use a 5 mm by 5 cm covered [place] viabahn. Rose in wire was passed through the base catheter and exchange was made for a 35  cm bright tip 6 French sheath. 65 cm Kumpe the catheter was then advanced through the sheath into the splenic artery. Through the Kumpe the catheter a microcatheter system was advanced into the splenic artery beyond the aneurysm neck. Once we confirmed location with a small angiogram, an 014 transcend wire was placed. On the first attempted delivery of the covered stent, there was ultimately prolapse of the system into the abdominal aorta. Stent was removed, microwire was removed, and exchange was made for a 6 French Brite tip 55 cm sheath. We then again selected the celiac artery with cobra catheter, with serial selection of the splenic artery and then the splenic artery beyond the aneurysm neck with a microcatheter system. The 65 cm Kumpe the catheter gained purchase within the mid segment of the splenic artery, and the longer, 55 cm Brite tip sheath was advanced into the proximal third of the splenic artery, although we could not advance around the tortuous segment. Again we placed an 0114 transcend wire beyond the neck of the aneurysm. At this point we attempted delivery of the covered  stent. The covered stent would not advanced beyond the tortuosity of the mid segment of the splenic artery. The stent was removed and microcatheter was advanced on the transcend wire. We then exchanged for a 014 BMW wire. Another attempt was made to pass the stent. The stent would not pass around the tortuous segment. We then elected to coil embolize the splenic artery in the branches beyond the aneurysm neck and proximal to the aneurysm neck, essentially proximal splenic artery embolization. Once the stent was removed from the system, a penumbra lantern microcatheter was advanced on the BMW wire and the BMW wire was removed. 014 fathom wire was used then to select the downstream artery from the aneurysm neck. A 4 mm penumbra occlusive device was placed. Catheter was withdrawn. We then used the dome of the aneurysm to deflect into the second downstream artery. A 4 mm penumbra occlusive device was placed. Catheter was withdrawn into the more proximal splenic artery. A 6 mm penumbra occlusive device was placed. Additional Ruby coils placed on the proximal aspect of the POD. Final angiogram was performed confirming no flow through the segment. All catheters wires were removed and Exoseal was placed for hemostasis. Patient tolerated procedure well and remained hemodynamically stable throughout. No complications were encountered and no significant blood loss. 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 ExoSeal deployed for hemostasis. Signed, Yvone Neu. Reyne Dumas, RPVI [facility] Radiology Electronically Signed   By: Gilmer Mor D.O.   On: 02/09/2020 09:20    Microbiology: Recent Results (from the past 240 hour(s))  Respiratory Panel by RT PCR (Flu A&B, Covid) - Nasopharyngeal Swab     Status: None   Collection Time:  02/07/20  2:09 PM   Specimen: Nasopharyngeal Swab  Result Value Ref Range Status   SARS Coronavirus 2 by RT PCR NEGATIVE NEGATIVE Final    Comment: (NOTE) SARS-CoV-2 target nucleic acids are NOT DETECTED.  The SARS-CoV-2 RNA is generally detectable in upper respiratoy specimens during the acute phase of infection. The lowest concentration of SARS-CoV-2 viral copies this assay can detect is 131 copies/mL. A negative result does not preclude SARS-Cov-2 infection and should not be used as the sole basis for treatment or other patient management decisions. A negative result may occur  with  improper specimen collection/handling, submission of specimen other than nasopharyngeal swab, presence of viral mutation(s) within the areas targeted by this assay, and inadequate number of viral copies (<131 copies/mL). A negative result must be combined with clinical observations, patient history, and epidemiological information. The expected result is Negative.  Fact Sheet for Patients:  https://www.moore.com/  Fact Sheet for Healthcare Providers:  https://www.young.biz/  This test is no t yet approved or cleared by the [place] FDA and  has been authorized for detection and/or diagnosis of SARS-CoV-2 by FDA under an Emergency Use Authorization (EUA). This EUA will remain  in effect (meaning this test can be used) for the duration of the COVID-19 declaration under Section 564(b)(1) of the Act, 21 U.S.C. section 360bbb-3(b)(1), unless the authorization is terminated or revoked sooner.     Influenza A by PCR NEGATIVE NEGATIVE Final   Influenza B by PCR NEGATIVE NEGATIVE Final    Comment: (NOTE) The Xpert Xpress SARS-CoV-2/FLU/RSV assay is intended as an aid in  the diagnosis of influenza from Nasopharyngeal swab specimens and  should not be used as a sole basis for treatment. Nasal washings and  aspirates are unacceptable for Xpert Xpress  SARS-CoV-2/FLU/RSV  testing.  Fact Sheet for Patients: https://www.moore.com/  Fact Sheet for Healthcare Providers: https://www.young.biz/  This test is not yet approved or cleared by the [place] FDA and  has been authorized for detection and/or diagnosis of SARS-CoV-2 by  FDA under an Emergency Use Authorization (EUA). This EUA will remain  in effect (meaning this test can be used) for the duration of the  Covid-19 declaration under Section 564(b)(1) of the Act, 21  U.S.C. section 360bbb-3(b)(1), unless the authorization is  terminated or revoked. Performed at [facility], [address] [place], [place] [address]   Urine culture     Status: Abnormal   Collection Time: 02/07/20  6:45 PM   Specimen: Urine, Clean Catch  Result Value Ref Range Status   Specimen Description   Final    URINE, CLEAN CATCH Performed at [facility], [address] [place], [place] [address]    Special Requests   Final    NONE Performed at [facility], [address] [place], [place] [address]    Culture MULTIPLE SPECIES PRESENT, SUGGEST RECOLLECTION (A)  Final   Report Status 02/09/2020 FINAL  Final  MRSA PCR Screening     Status: None   Collection Time: 02/07/20  8:25 PM   Specimen: Nasal Mucosa; Nasopharyngeal  Result Value Ref Range Status   MRSA by PCR NEGATIVE NEGATIVE Final    Comment:        The GeneXpert MRSA Assay (FDA approved for NASAL specimens only), is one component of a comprehensive MRSA colonization surveillance program. It is not intended to diagnose MRSA infection nor to guide or monitor treatment for MRSA infections. Performed at [facility], [address], [place], [place] [address]      Labs: Basic Metabolic Panel: Recent Labs  Lab 02/07/20 1152 02/08/20 0308 02/08/20 2214 02/09/20 0302 02/10/20 0246  NA 137 137 138 138 138  K 4.6  4.4 3.8 4.3 4.3  CL 98 102 103 103 102  CO2 25 28 28 28 31   GLUCOSE 217* 115* 113* 118* 110*  BUN 33* 29* 19 18 13   CREATININE 3.10* 1.52* 1.09 0.97 0.98  CALCIUM 9.9 8.6* 8.3* 8.3* 8.6*   Liver Function Tests: Recent Labs  Lab 02/07/20 1152 02/08/20  0308 02/09/20 0302 02/10/20 0246  AST 28 20 27 27   ALT 31 23 22 20   ALKPHOS 50 35* 38 35*  BILITOT 0.8 0.8 0.8 0.9  PROT 7.7 5.9* 5.8* 5.3*  ALBUMIN 4.7 3.4* 3.6 3.2*   Recent Labs  Lab 02/07/20 1152  LIPASE 22   No results for input(s): AMMONIA in the last 168 hours. CBC: Recent Labs  Lab 02/07/20 1152 02/07/20 1152 02/07/20 1732 02/07/20 2109 02/08/20 0308 02/08/20 1137 02/09/20 0302 02/09/20 1201 02/09/20 1921 02/10/20 0246 02/10/20 1108  WBC 17.7*  --  14.9*  --  9.9  --   --   --   --  9.4  --   HGB 13.0   < > 11.0*   < > 9.2*  9.3*   < > 8.6* 9.1* 8.4* 8.2* 9.8*  HCT 38.5*   < > 32.3*   < > 27.6*  28.2*   < > 26.0* 28.1* 26.4* 25.3* 29.4*  MCV 101.0*  --  100.9*  --  103.7*  --   --   --   --  105.4*  --   PLT 231  --  193  --  162  --   --   --   --  148*  --    < > = values in this interval not displayed.   Cardiac Enzymes: Recent Labs  Lab 02/07/20 1358  CKTOTAL 54   BNP: BNP (last 3 results) No results for input(s): BNP in the last 8760 hours.  ProBNP (last 3 results) No results for input(s): PROBNP in the last 8760 hours.  CBG: No results for input(s): GLUCAP in the last 168 hours.     Signed:  Darlin Drop, MD [facility] 02/10/2020, 3:24 PM

## 2020-02-11 ENCOUNTER — Other Ambulatory Visit: Payer: Self-pay | Admitting: Interventional Radiology

## 2020-02-11 DIAGNOSIS — I728 Aneurysm of other specified arteries: Secondary | ICD-10-CM

## 2020-02-11 LAB — TYPE AND SCREEN
ABO/RH(D): A POS
Antibody Screen: NEGATIVE
Unit division: 0
Unit division: 0
Unit division: 0
Unit division: 0

## 2020-02-11 LAB — BPAM RBC
Blood Product Expiration Date: 202110092359
Blood Product Expiration Date: 202110122359
Blood Product Expiration Date: 202110132359
Blood Product Expiration Date: 202110142359
Unit Type and Rh: 6200
Unit Type and Rh: 6200
Unit Type and Rh: 6200
Unit Type and Rh: 6200

## 2020-03-10 ENCOUNTER — Telehealth: Payer: Medicare HMO

## 2020-03-17 ENCOUNTER — Ambulatory Visit
Admission: RE | Admit: 2020-03-17 | Discharge: 2020-03-17 | Disposition: A | Discharge status: Home / Self Care | Payer: Medicare HMO | Source: Ambulatory Visit | Attending: Interventional Radiology | Admitting: Interventional Radiology

## 2020-03-17 ENCOUNTER — Encounter: Payer: Self-pay | Admitting: *Deleted

## 2020-03-17 DIAGNOSIS — I728 Aneurysm of other specified arteries: Secondary | ICD-10-CM

## 2020-03-17 HISTORY — PX: IR RADIOLOGIST EVAL & MGMT: IMG5224

## 2020-03-17 NOTE — Progress Notes (Signed)
Chief Complaint: Follow up splenic artery aneurysm embolization   Referring Physician(s): Dr. Ruel Favors PCP: Dr. Royston Sinner Corrington  History of Present Illness: Robert Mullen is a 69 y.o. male presenting today as a scheduled follow up to VIR clinic, SP angiogram and embolization of splenic artery aneurysm.   Mr Ramus joins [place] today by virtual visit.  We confirmed his identity with 2 personal identifiers.   Mr Abernathy was hospitalized at [facility] via ED admission on 02/07/2020 for acute abdominal pain related to rupture of splenic artery aneurysm.    He underwent angiogram and proximal splenic artery coil embolization 02/08/2020. He was discharged 02/10/2020.  On discharge, his BMP confirmed return of normal renal function, with creatinine of 0.98, and stable hgb of 9.8.    Since his discharge, he tells me that his abdominal pain has slowly resolved, such that he has comfortably returned to normal activity, including playing tennis.  He also experienced some changes in bowel habits, with less frequent bowel movements/constipation, returning to normal.    We discussed the site of access in the right hip, of which he has no concerns.    Past Medical History:  Diagnosis Date  . GERD (gastroesophageal reflux disease)     Past Surgical History:  Procedure Laterality Date  . IR ANGIOGRAM SELECTIVE EACH ADDITIONAL VESSEL  02/08/2020  . IR ANGIOGRAM SELECTIVE EACH ADDITIONAL VESSEL  02/08/2020  . IR ANGIOGRAM VISCERAL SELECTIVE  02/08/2020  . IR EMBO ARTERIAL NOT HEMORR HEMANG INC GUIDE ROADMAPPING  02/08/2020  . IR TRANSCATH PLC STENT 1ST ART NOT LE CV CAR VERT CAR  02/08/2020  . IR US GUIDE VASC ACCESS RIGHT  02/08/2020    Allergies: Amoxicillin-pot clavulanate  Medications: Prior to Admission medications   Medication Sig Start Date End Date Taking? Authorizing Provider  pantoprazole (PROTONIX) 40 MG tablet Take 1 tablet (40 mg total) by mouth daily. 02/11/20 03/12/20  Darlin Drop, DO  polyethylene glycol (MIRALAX) 17 g packet Take 17 g by mouth daily. 02/10/20   Darlin Drop, DO  pravastatin (PRAVACHOL) 40 MG tablet Take 1 tablet by mouth daily. 01/19/20   [provider]     No family history on file.  Social History   Socioeconomic History  . Marital status: Married    Spouse name: Not on file  . Number of children: Not on file  . Years of education: Not on file  . Highest education level: Not on file  Occupational History  . Not on file  Tobacco Use  . Smoking status: Never Smoker  . Smokeless tobacco: Never Used  Substance and Sexual Activity  . Alcohol use: Not Currently  . Drug use: Not Currently  . Sexual activity: Not on file  Other Topics Concern  . Not on file  Social History Narrative  . Not on file   Social Determinants of Health   Financial Resource Strain:   . Difficulty of Paying Living Expenses: Not on file  Food Insecurity:   . Worried About Programme researcher, broadcasting/film/video in the Last Year: Not on file  . Ran Out of Food in the Last Year: Not on file  Transportation Needs:   . Lack of Transportation (Medical): Not on file  . Lack of Transportation (Non-Medical): Not on file  Physical Activity:   . Days of Exercise per Week: Not on file  . Minutes of Exercise per Session: Not on file  Stress:   . Feeling of Stress :  Not on file  Social Connections:   . Frequency of Communication with Friends and Family: Not on file  . Frequency of Social Gatherings with Friends and Family: Not on file  . Attends Religious Services: Not on file  . Active Member of Clubs or Organizations: Not on file  . Attends Banker Meetings: Not on file  . Marital Status: Not on file       Review of Systems  Review of Systems: A 12 point ROS discussed and pertinent positives are indicated in the HPI above.  All other systems are negative.  Physical Exam No direct physical exam was performed (except for noted visual exam findings with  Video Visits).    Vital Signs: There were no vitals taken for this visit.  Imaging: No results found.  Labs:  CBC: Recent Labs    02/07/20 1152 02/07/20 1152 02/07/20 1732 02/07/20 2109 02/08/20 0308 02/08/20 1137 02/09/20 1201 02/09/20 1921 02/10/20 0246 02/10/20 1108  WBC 17.7*  --  14.9*  --  9.9  --   --   --  9.4  --   HGB 13.0   < > 11.0*   < > 9.2*  9.3*   < > 9.1* 8.4* 8.2* 9.8*  HCT 38.5*   < > 32.3*   < > 27.6*  28.2*   < > 28.1* 26.4* 25.3* 29.4*  PLT 231  --  193  --  162  --   --   --  148*  --    < > = values in this interval not displayed.    COAGS: Recent Labs    02/07/20 2109  INR 1.0    BMP: Recent Labs    02/08/20 0308 02/08/20 2214 02/09/20 0302 02/10/20 0246  NA 137 138 138 138  K 4.4 3.8 4.3 4.3  CL 102 103 103 102  CO2 28 28 28 31   GLUCOSE 115* 113* 118* 110*  BUN 29* 19 18 13   CALCIUM 8.6* 8.3* 8.3* 8.6*  CREATININE 1.52* 1.09 0.97 0.98  GFRNONAA 47* >60 >60 >60  GFRAA 54* >60 >60 >60    LIVER FUNCTION TESTS: Recent Labs    02/07/20 1152 02/08/20 0308 02/09/20 0302 02/10/20 0246  BILITOT 0.8 0.8 0.8 0.9  AST 28 20 27 27   ALT 31 23 22 20   ALKPHOS 50 35* 38 35*  PROT 7.7 5.9* 5.8* 5.3*  ALBUMIN 4.7 3.4* 3.6 3.2*    TUMOR MARKERS: No results for input(s): AFPTM, CEA, CA199, CHROMGRNA in the last 8760 hours.  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   Today we reviewed the logistics of his treatment, the particulars of splenic artery occlusion including our expectations of at least partial splenic infarction, his resolving symptoms (which are expected and are following a typical course), and the natural history of a treated splenic artery aneurysm.    I assured him that there is very little if any risk of further aneurysm in the splenic artery distribution, given the absence of any associated risk factors  such as connective tissue disorder, prior trauma, or vasculitis.  Surveillance is generally not indicated.    We discussed his restored renal function, which had returned to normal on his discharge.  There is no risk for any delayed renal dysfunction secondary to the remote contrast load from the angiogram.  In the absence of any other immediate  concern, repeat lab values for surveillance would be low yield.    I did encourage him to continue to exercise, and that at this point he can safely return to his prior activities.   We are happy to see him back as needed.      Electronically Signed: Gilmer Mor 03/17/2020, 11:02 AM   I spent a total of    25 Minutes in remote  clinical consultation, greater than 50% of which was counseling/coordinating care for treated ruptured splenic artery aneurysm, SP coil embolization.    Visit type: Audio only (telephone). Audio (no video) only due to not available. Alternative for in-person consultation at [facility], [address], [place], [place]. This visit type was conducted due to national recommendations for restrictions regarding the COVID-19 Pandemic (e.g. social distancing).  This format is felt to be most appropriate for this patient at this time.  All issues noted in this document were discussed and addressed.

## 2020-11-21 ENCOUNTER — Ambulatory Visit (INDEPENDENT_AMBULATORY_CARE_PROVIDER_SITE_OTHER): Payer: Medicare HMO

## 2020-11-21 ENCOUNTER — Ambulatory Visit: Payer: Medicare HMO | Admitting: Pulmonary Disease

## 2020-11-21 ENCOUNTER — Encounter: Payer: Self-pay | Admitting: Pulmonary Disease

## 2020-11-21 ENCOUNTER — Other Ambulatory Visit: Payer: Self-pay

## 2020-11-21 VITALS — BP 110/78 | HR 63 | Ht 72.0 in | Wt 173.0 lb

## 2020-11-21 DIAGNOSIS — K219 Gastro-esophageal reflux disease without esophagitis: Secondary | ICD-10-CM | POA: Diagnosis not present

## 2020-11-21 DIAGNOSIS — R059 Cough, unspecified: Secondary | ICD-10-CM

## 2020-11-21 DIAGNOSIS — R0982 Postnasal drip: Secondary | ICD-10-CM

## 2020-11-21 DIAGNOSIS — J302 Other seasonal allergic rhinitis: Secondary | ICD-10-CM

## 2020-11-21 NOTE — Progress Notes (Signed)
Synopsis: Referred in July 2022 for cough by Vivien Presto, MD  Subjective:   PATIENT ID: Robert Andrea GENDER: male DOB: April 08, 1951, MRN: [id number]  Chief Complaint  Patient presents with   Consult    Cough non productive present for about  year, seen by other pulmonologist    PMH of GERD, splenic aneursym. Occupation working as a Best boy. Cough has been going on at least 2 years. Was seen by pulmonary at [facility] >1 year ago. More noticeable for the past 3-4 months. Had severe URI that stimulated the cough. Cough still lingers. Stimulated by strong smells and fumes or even smoke in the kitchen. He thinks he is taking a PPI. He did feel like starting that a year ago helped his cough.  However he is not sure if he has been taking the PPI versus doubling up on his antihistamine.  He has no significant warning symptoms.  No fevers chills.  No hemoptysis.   Past Medical History:  Diagnosis Date   GERD (gastroesophageal reflux disease)    Splenic artery aneurysm (HCC)      Family History  Problem Relation Age of Onset   Cancer Mother    Heart failure Father      Past Surgical History:  Procedure Laterality Date   IR ANGIOGRAM SELECTIVE EACH ADDITIONAL VESSEL  02/08/2020   IR ANGIOGRAM SELECTIVE EACH ADDITIONAL VESSEL  02/08/2020   IR ANGIOGRAM VISCERAL SELECTIVE  02/08/2020   IR EMBO ARTERIAL NOT HEMORR HEMANG INC GUIDE ROADMAPPING  02/08/2020   IR RADIOLOGIST EVAL & MGMT  03/17/2020   IR TRANSCATH PLC STENT 1ST ART NOT LE CV CAR VERT CAR  02/08/2020   IR US GUIDE VASC ACCESS RIGHT  02/08/2020    Social History   Socioeconomic History   Marital status: Married    Spouse name: Not on file   Number of children: Not on file   Years of education: Not on file   Highest education level: Not on file  Occupational History   Not on file  Tobacco Use   Smoking status: Never   Smokeless tobacco: Never  Substance and Sexual Activity   Alcohol use: Not Currently   Drug  use: Not Currently   Sexual activity: Not on file  Other Topics Concern   Not on file  Social History Narrative   Not on file   Social Determinants of Health   Financial Resource Strain: Not on file  Food Insecurity: Not on file  Transportation Needs: Not on file  Physical Activity: Not on file  Stress: Not on file  Social Connections: Not on file  Intimate Partner Violence: Not on file     Allergies  Allergen Reactions   Amoxicillin-Pot Clavulanate     Other reaction(s): Abdominal Pain     Outpatient Medications Prior to Visit  Medication Sig Dispense Refill   glucosamine-chondroitin 500-400 MG tablet Take 1 tablet by mouth 3 (three) times daily.     pantoprazole (PROTONIX) 40 MG tablet Take 1 tablet (40 mg total) by mouth daily. 30 tablet 0   polyethylene glycol (MIRALAX) 17 Mullen packet Take 17 Mullen by mouth daily. 14 each 0   pravastatin (PRAVACHOL) 40 MG tablet Take 1 tablet by mouth daily.     No facility-administered medications prior to visit.    Review of Systems  Constitutional:  Negative for chills, fever, malaise/fatigue and weight loss.  HENT:  Negative for hearing loss, sore throat and tinnitus.   Eyes:  Negative for blurred vision and double vision.  Respiratory:  Positive for cough. Negative for hemoptysis, sputum production, shortness of breath, wheezing and stridor.   Cardiovascular:  Negative for chest pain, palpitations, orthopnea, leg swelling and PND.  Gastrointestinal:  Negative for abdominal pain, constipation, diarrhea, heartburn, nausea and vomiting.  Genitourinary:  Negative for dysuria, hematuria and urgency.  Musculoskeletal:  Negative for joint pain and myalgias.  Skin:  Negative for itching and rash.  Neurological:  Negative for dizziness, tingling, weakness and headaches.  Endo/Heme/Allergies:  Negative for environmental allergies. Does not bruise/bleed easily.  Psychiatric/Behavioral:  Negative for depression. The patient is not nervous/anxious  and does not have insomnia.   All other systems reviewed and are negative.   Objective:  Physical Exam Vitals reviewed.  Constitutional:      General: He is not in acute distress.    Appearance: He is well-developed.  HENT:     Head: Normocephalic and atraumatic.  Eyes:     General: No scleral icterus.    Conjunctiva/sclera: Conjunctivae normal.     Pupils: Pupils are equal, round, and reactive to light.  Neck:     Vascular: No JVD.     Trachea: No tracheal deviation.  Cardiovascular:     Rate and Rhythm: Normal rate and regular rhythm.     Heart sounds: Normal heart sounds. No murmur heard. Pulmonary:     Effort: Pulmonary effort is normal. No tachypnea, accessory muscle usage or respiratory distress.     Breath sounds: No stridor. No wheezing, rhonchi or rales.  Abdominal:     General: Bowel sounds are normal. There is no distension.     Palpations: Abdomen is soft.     Tenderness: There is no abdominal tenderness.  Musculoskeletal:        General: No tenderness.     Cervical back: Neck supple.  Lymphadenopathy:     Cervical: No cervical adenopathy.  Skin:    General: Skin is warm and dry.     Capillary Refill: Capillary refill takes less than 2 seconds.     Findings: No rash.  Neurological:     Mental Status: He is alert and oriented to person, place, and time.  Psychiatric:        Behavior: Behavior normal.     Vitals:   11/21/20 1332  BP: 110/78  Pulse: 63  SpO2: 98%  Weight: 173 lb (78.5 kg)  Height: 6' (1.829 m)   98% on RA BMI Readings from Last 3 Encounters:  11/21/20 23.46 kg/m  02/08/20 23.53 kg/m   Wt Readings from Last 3 Encounters:  11/21/20 173 lb (78.5 kg)  02/08/20 173 lb 8 oz (78.7 kg)     CBC    Component Value Date/Time   WBC 9.4 02/10/2020 0246   RBC 2.40 (L) 02/10/2020 0246   HGB 9.8 (L) 02/10/2020 1108   HCT 29.4 (L) 02/10/2020 1108   PLT 148 (L) 02/10/2020 0246   MCV 105.4 (H) 02/10/2020 0246   MCH 34.2 (H) 02/10/2020  0246   MCHC 32.4 02/10/2020 0246   RDW 11.9 02/10/2020 0246      Chest Imaging: No recent chest imaging.  Care everywhere 01/01/2019 [facility] at [facility] with no acute infiltrate.  Results reviewed.  Pulmonary Functions Testing Results: No flowsheet data found.  FeNO:   Pathology:   Echocardiogram:   Heart Catheterization:     Assessment & Plan:     ICD-10-CM   1. Cough  R05.9 DG Chest  2 View    2. Gastroesophageal reflux disease, unspecified whether esophagitis present  K21.9     3. Seasonal allergies  J30.2     4. PND (post-nasal drip)  R09.82       Discussion:  70 year old male, presents today with ongoing cough for greater than 2 years.  Recently had a URI that caused flareup of his cough again.  Has been going on now for the past 3 to 4 months.  He feels that it is relatively the same.  A year ago saw pulmonologist was placed on PPI and his cough improved.  He is also taking allergy medications.  At this point he is not sure if he is taking to allergy medications and he has inadvertently left off the use of his PPI.  Plan: Recommend restarting his PPI and monitoring his symptoms. Continue his daily antihistamine for his allergies. If his symptoms continue to persist we could consider adding an inhaler to see if it improves his symptoms even though his story is not completely consistent with asthma.  He may have reactive airway disease following he has URI that was about 3 months ago.  Patient was given samples today of inhaler to use if his symptoms do not go away after restarting his PPI.  Patient to follow up with [place] in approximately 8 weeks or as needed based on symptoms    Current Outpatient Medications:    glucosamine-chondroitin 500-400 MG tablet, Take 1 tablet by mouth 3 (three) times daily., Disp: , Rfl:    pantoprazole (PROTONIX) 40 MG tablet, Take 1 tablet (40 mg total) by mouth daily., Disp: 30 tablet, Rfl: 0   polyethylene glycol  (MIRALAX) 17 Mullen packet, Take 17 Mullen by mouth daily., Disp: 14 each, Rfl: 0   pravastatin (PRAVACHOL) 40 MG tablet, Take 1 tablet by mouth daily., Disp: , Rfl:     Josephine Igo, DO  Pulmonary Critical Care 11/21/2020 1:52 PM

## 2020-11-21 NOTE — Patient Instructions (Signed)
Thank you for visiting Dr. Tonia Brooms at [facility]. Today we recommend the following:  Orders Placed This Encounter  Procedures   DG Chest 2 View   Samples today of Breztri   Return in about 8 weeks (around 01/16/2021) for with APP or Dr. Tonia Brooms.    Please do your part to reduce the spread of COVID-19.

## 2020-11-22 MED ORDER — BREZTRI AEROSPHERE 160-9-4.8 MCG/ACT IN AERO: 2.0000 | INHALATION_SPRAY | Freq: Two times a day (BID) | RESPIRATORY_TRACT | 0 refills | Status: DC

## 2020-11-22 NOTE — Addendum Note (Signed)
Addended by: Judd Gaudier on: 11/22/2020 10:05 AM   Modules accepted: Orders

## 2021-01-18 ENCOUNTER — Ambulatory Visit: Payer: Medicare HMO | Admitting: Pulmonary Disease

## 2021-01-18 ENCOUNTER — Encounter: Payer: Self-pay | Admitting: Pulmonary Disease

## 2021-01-18 ENCOUNTER — Other Ambulatory Visit: Payer: Self-pay

## 2021-01-18 VITALS — BP 114/78 | HR 68 | Temp 98.0°F | Ht 72.0 in | Wt 170.8 lb

## 2021-01-18 DIAGNOSIS — K219 Gastro-esophageal reflux disease without esophagitis: Secondary | ICD-10-CM

## 2021-01-18 DIAGNOSIS — R059 Cough, unspecified: Secondary | ICD-10-CM | POA: Diagnosis not present

## 2021-01-18 MED ORDER — OMEPRAZOLE 20 MG PO CPDR: 20.0000 mg | DELAYED_RELEASE_CAPSULE | Freq: Every day | ORAL | 11 refills | Status: DC

## 2021-01-18 NOTE — Progress Notes (Signed)
Synopsis: Referred in July 2022 for cough by Vivien Presto, MD  Subjective:   PATIENT ID: Robert Mullen GENDER: male DOB: 1951/04/10, MRN: [id number]  Chief Complaint  Patient presents with   Follow-up    Cough is better.     PMH of GERD, splenic aneursym. Occupation working as a Best boy. Cough has been going on at least 2 years. Was seen by pulmonary at [facility] >1 year ago. More noticeable for the past 3-4 months. Had severe URI that stimulated the cough. Cough still lingers. Stimulated by strong smells and fumes or even smoke in the kitchen. He thinks he is taking a PPI. He did feel like starting that a year ago helped his cough.  However he is not sure if he has been taking the PPI versus doubling up on his antihistamine.  He has no significant warning symptoms.  No fevers chills.  No hemoptysis.  OV 01/18/2021: Patient cough is now better.  Taking PPI.  Overall has no significant complaints.  Feels like he is getting better.   Past Medical History:  Diagnosis Date   GERD (gastroesophageal reflux disease)    Splenic artery aneurysm (HCC)      Family History  Problem Relation Age of Onset   Cancer Mother    Heart failure Father      Past Surgical History:  Procedure Laterality Date   IR ANGIOGRAM SELECTIVE EACH ADDITIONAL VESSEL  02/08/2020   IR ANGIOGRAM SELECTIVE EACH ADDITIONAL VESSEL  02/08/2020   IR ANGIOGRAM VISCERAL SELECTIVE  02/08/2020   IR EMBO ARTERIAL NOT HEMORR HEMANG INC GUIDE ROADMAPPING  02/08/2020   IR RADIOLOGIST EVAL & MGMT  03/17/2020   IR TRANSCATH PLC STENT 1ST ART NOT LE CV CAR VERT CAR  02/08/2020   IR US GUIDE VASC ACCESS RIGHT  02/08/2020    Social History   Socioeconomic History   Marital status: Married    Spouse name: Not on file   Number of children: Not on file   Years of education: Not on file   Highest education level: Not on file  Occupational History   Not on file  Tobacco Use   Smoking status: Never   Smokeless tobacco:  Never  Substance and Sexual Activity   Alcohol use: Not Currently   Drug use: Not Currently   Sexual activity: Not on file  Other Topics Concern   Not on file  Social History Narrative   Not on file   Social Determinants of Health   Financial Resource Strain: Not on file  Food Insecurity: Not on file  Transportation Needs: Not on file  Physical Activity: Not on file  Stress: Not on file  Social Connections: Not on file  Intimate Partner Violence: Not on file     Allergies  Allergen Reactions   Amoxicillin-Pot Clavulanate     Other reaction(s): Abdominal Pain     Outpatient Medications Prior to Visit  Medication Sig Dispense Refill   Budeson-Glycopyrrol-Formoterol (BREZTRI AEROSPHERE) 160-9-4.8 MCG/ACT AERO Inhale 2 puffs into the lungs in the morning and at bedtime. 10.7 g 0   glucosamine-chondroitin 500-400 MG tablet Take 1 tablet by mouth 3 (three) times daily.     loratadine-pseudoephedrine (CLARITIN-D 24-HOUR) 10-240 MG 24 hr tablet Take 1 tablet by mouth daily.     pravastatin (PRAVACHOL) 40 MG tablet Take 1 tablet by mouth daily.     pantoprazole (PROTONIX) 40 MG tablet Take 1 tablet (40 mg total) by mouth daily. 30 tablet 0  polyethylene glycol (MIRALAX) 17 g packet Take 17 g by mouth daily. (Patient not taking: Reported on 01/18/2021) 14 each 0   No facility-administered medications prior to visit.    Review of Systems  Constitutional:  Negative for chills, fever, malaise/fatigue and weight loss.  HENT:  Negative for hearing loss, sore throat and tinnitus.   Eyes:  Negative for blurred vision and double vision.  Respiratory:  Negative for cough, hemoptysis, sputum production, shortness of breath, wheezing and stridor.   Cardiovascular:  Negative for chest pain, palpitations, orthopnea, leg swelling and PND.  Gastrointestinal:  Negative for abdominal pain, constipation, diarrhea, heartburn, nausea and vomiting.  Genitourinary:  Negative for dysuria, hematuria and  urgency.  Musculoskeletal:  Negative for joint pain and myalgias.  Skin:  Negative for itching and rash.  Neurological:  Negative for dizziness, tingling, weakness and headaches.  Endo/Heme/Allergies:  Negative for environmental allergies. Does not bruise/bleed easily.  Psychiatric/Behavioral:  Negative for depression. The patient is not nervous/anxious and does not have insomnia.   All other systems reviewed and are negative.   Objective:  Physical Exam Vitals reviewed.  Constitutional:      General: He is not in acute distress.    Appearance: He is well-developed.  HENT:     Head: Normocephalic and atraumatic.  Eyes:     General: No scleral icterus.    Conjunctiva/sclera: Conjunctivae normal.     Pupils: Pupils are equal, round, and reactive to light.  Neck:     Vascular: No JVD.     Trachea: No tracheal deviation.  Cardiovascular:     Rate and Rhythm: Normal rate and regular rhythm.     Heart sounds: Normal heart sounds. No murmur heard. Pulmonary:     Effort: Pulmonary effort is normal. No tachypnea, accessory muscle usage or respiratory distress.     Breath sounds: Normal breath sounds. No stridor. No wheezing, rhonchi or rales.  Abdominal:     General: Bowel sounds are normal. There is no distension.     Palpations: Abdomen is soft.     Tenderness: There is no abdominal tenderness.  Musculoskeletal:        General: No tenderness.     Cervical back: Neck supple.  Lymphadenopathy:     Cervical: No cervical adenopathy.  Skin:    General: Skin is warm and dry.     Capillary Refill: Capillary refill takes less than 2 seconds.     Findings: No rash.  Neurological:     Mental Status: He is alert and oriented to person, place, and time.  Psychiatric:        Behavior: Behavior normal.     Vitals:   01/18/21 1011  BP: 114/78  Pulse: 68  Temp: 98 F (36.7 C)  TempSrc: Oral  SpO2: 99%  Weight: 170 lb 12.8 oz (77.5 kg)  Height: 6' (1.829 m)   99% on RA BMI  Readings from Last 3 Encounters:  01/18/21 23.16 kg/m  11/21/20 23.46 kg/m  02/08/20 23.53 kg/m   Wt Readings from Last 3 Encounters:  01/18/21 170 lb 12.8 oz (77.5 kg)  11/21/20 173 lb (78.5 kg)  02/08/20 173 lb 8 oz (78.7 kg)     CBC    Component Value Date/Time   WBC 9.4 02/10/2020 0246   RBC 2.40 (L) 02/10/2020 0246   HGB 9.8 (L) 02/10/2020 1108   HCT 29.4 (L) 02/10/2020 1108   PLT 148 (L) 02/10/2020 0246   MCV 105.4 (H) 02/10/2020 0246  MCH 34.2 (H) 02/10/2020 0246   MCHC 32.4 02/10/2020 0246   RDW 11.9 02/10/2020 0246      Chest Imaging: No recent chest imaging.  Care everywhere 01/01/2019 chest [facility] at [facility] with no acute infiltrate.  Results reviewed.  Pulmonary Functions Testing Results: No flowsheet data found.  FeNO:   Pathology:   Echocardiogram:   Heart Catheterization:     Assessment & Plan:     ICD-10-CM   1. Cough  R05.9     2. Gastroesophageal reflux disease, unspecified whether esophagitis present  K21.9        Discussion:  70 year old gentleman ongoing cough for greater than 2 years.  Had URI symptoms that flared up his cough again.  Has felt that PPI has made his cough better as well as taking allergy medications.  Plan: Continue his PPI New prescription today for omeprazole. Continue antihistamines for allergy symptoms. Can follow-up with [place] as needed.    Current Outpatient Medications:    Budeson-Glycopyrrol-Formoterol (BREZTRI AEROSPHERE) 160-9-4.8 MCG/ACT AERO, Inhale 2 puffs into the lungs in the morning and at bedtime., Disp: 10.7 g, Rfl: 0   glucosamine-chondroitin 500-400 MG tablet, Take 1 tablet by mouth 3 (three) times daily., Disp: , Rfl:    loratadine-pseudoephedrine (CLARITIN-D 24-HOUR) 10-240 MG 24 hr tablet, Take 1 tablet by mouth daily., Disp: , Rfl:    omeprazole (PRILOSEC) 20 MG capsule, Take 1 capsule (20 mg total) by mouth daily., Disp: 30 capsule, Rfl: 11   pravastatin (PRAVACHOL) 40 MG tablet,  Take 1 tablet by mouth daily., Disp: , Rfl:    polyethylene glycol (MIRALAX) 17 g packet, Take 17 g by mouth daily. (Patient not taking: Reported on 01/18/2021), Disp: 14 each, Rfl: 0    Josephine Igo, DO [facility] 01/18/2021 5:33 PM

## 2021-01-18 NOTE — Patient Instructions (Signed)
Thank you for visiting Dr. Tonia Brooms at [facility]. Today we recommend the following:  Meds ordered this encounter  Medications   omeprazole (PRILOSEC) 20 MG capsule    Sig: Take 1 capsule (20 mg total) by mouth daily.    Dispense:  30 capsule    Refill:  11   Return if symptoms worsen or fail to improve.    Please do your part to reduce the spread of COVID-19.

## 2022-01-13 ENCOUNTER — Other Ambulatory Visit: Payer: Self-pay | Admitting: Pulmonary Disease

## 2022-04-12 ENCOUNTER — Ambulatory Visit: Payer: Medicare HMO | Admitting: Podiatry

## 2022-04-12 ENCOUNTER — Ambulatory Visit: Payer: Medicare HMO

## 2022-04-12 DIAGNOSIS — M79672 Pain in left foot: Secondary | ICD-10-CM

## 2022-04-12 DIAGNOSIS — M2062 Acquired deformities of toe(s), unspecified, left foot: Secondary | ICD-10-CM

## 2022-04-12 DIAGNOSIS — M2022 Hallux rigidus, left foot: Secondary | ICD-10-CM | POA: Diagnosis not present

## 2022-04-12 NOTE — Progress Notes (Signed)
Subjective:   Patient ID: Robert Mullen, male   DOB: 71 y.o.   MRN: [id number]   HPI Chief Complaint  Patient presents with   Bunions    Left foot bunion pain and soft tissue mass on the left hallux between the 2nd toe     71 male presents the office for above concerns.  He has a history of an injury several years ago to the left foot. He has surery with Dr. Leeanne Deed to help several years ago. Now he has been getting pain to the big toe. He has also develop a growth on the lateral side of the hallux. He trims it down himself.  No recent injuries.   Review of Systems  All other systems reviewed and are negative.  Past Medical History:  Diagnosis Date   GERD (gastroesophageal reflux disease)    Splenic artery aneurysm [facility])     Past Surgical History:  Procedure Laterality Date   IR ANGIOGRAM SELECTIVE EACH ADDITIONAL VESSEL  02/08/2020   IR ANGIOGRAM SELECTIVE EACH ADDITIONAL VESSEL  02/08/2020   IR ANGIOGRAM VISCERAL SELECTIVE  02/08/2020   IR EMBO ARTERIAL NOT HEMORR HEMANG INC GUIDE ROADMAPPING  02/08/2020   IR RADIOLOGIST EVAL & MGMT  03/17/2020   IR TRANSCATH PLC STENT 1ST ART NOT LE CV CAR VERT CAR  02/08/2020   IR US GUIDE VASC ACCESS RIGHT  02/08/2020     Current Outpatient Medications:    montelukast (SINGULAIR) 10 MG tablet, Take 1 tablet by mouth daily., Disp: , Rfl:    omeprazole (PRILOSEC) 20 MG capsule, TAKE 1 CAPSULE BY MOUTH EVERY DAY, Disp: 90 capsule, Rfl: 3   pravastatin (PRAVACHOL) 40 MG tablet, Take 1 tablet by mouth daily., Disp: , Rfl:   Allergies  Allergen Reactions   Amoxicillin-Pot Clavulanate     Other reaction(s): Abdominal Pain       Objective:  Physical Exam  General: AAO x3, NAD  Dermatological: Hyperkeratotic lesion on the lateral aspect of the hallux.  No ongoing ulceration drainage or signs of infection.  Vascular: Dorsalis Pedis artery and Posterior Tibial artery pedal pulses are 2/4 bilateral with immedate capillary fill time.  There is  no pain with calf compression, swelling, warmth, erythema.   Neruologic: Grossly intact via light touch bilateral.   Musculoskeletal: Arthritic changes present the first MPJ with decreased range of motion.  He pain along the IPJ of the toe.  There is medial deviation of the digits on the left foot but this is also noted on the right foot but not as severe.  Gait: Unassisted, Nonantalgic.       Assessment:   Hallux rigidus, capsulitis left foot with hyperkeratotic lesion due to digital formerly     Plan:  -Treatment options discussed including all alternatives, risks, and complications -Etiology of symptoms were discussed -X-rays were obtained and reviewed with the patient.  3 views of the foot were obtained.  No evidence of acute fracture.  Arthritic changes present of the first MPJ.  Medial deviation of the lesser digits. -We discussed both conservative as well as surgical treatment options.  Conservatively discussed shoe modifications, toe spacers, wearing stiffer soled shoe, orthotics.  Unfortunately given the arthritis as well as the deviation of the digits only way to fix this would be surgery.  If he desires surgery would likely need to proceed with first MPJ arthrodesis but given the callus on the hallux we need to straighten the second toe.  If he did that would also  need to the third toe given the curbing of that as well.  For now he is in continue with conservative care.  I sharply debrided the callus as a courtesy without any complications or bleeding.  Dispensed offloading pads.  Vivi Barrack DPM

## 2022-04-13 ENCOUNTER — Other Ambulatory Visit: Payer: Self-pay | Admitting: Podiatry

## 2022-04-13 DIAGNOSIS — M79672 Pain in left foot: Secondary | ICD-10-CM

## 2022-05-24 IMAGING — CT CT ABD-PELV W/O CM
2 of 4 series · 15 of 46 positions shown, 17 images · non-contrast
Comparison: Earlier CT scan, same date.

CLINICAL DATA: GI bleed. Abdominal pain and cramping for 24 hours.
Abnormal previous CT scan.

EXAM:
CT ABDOMEN AND PELVIS WITHOUT CONTRAST
TECHNIQUE: Multidetector CT imaging of the abdomen and pelvis was performed
following the standard protocol without IV contrast.

[Series 2: axial st · axial · 0.78mm/px · z∈[+1144,+1580]mm · 12 of 99 slices shown, 14 images]
[im 6/99  soft-tissue]
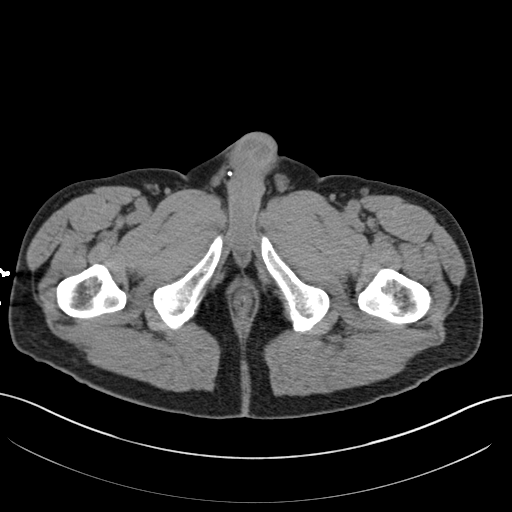
[im 6/99  bone]
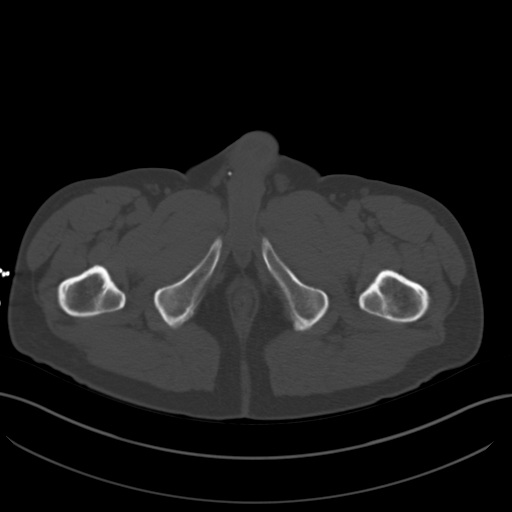
[im 16/99  soft-tissue]
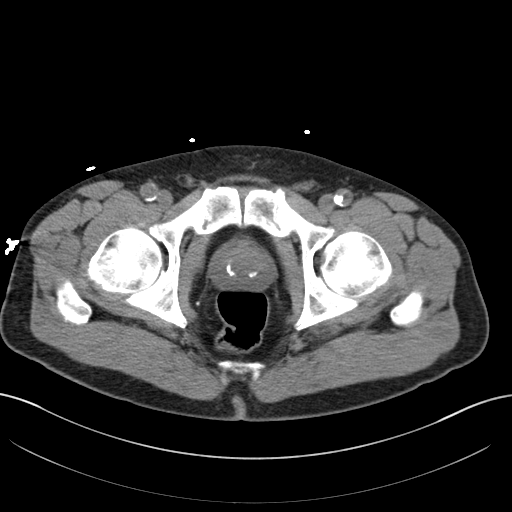
[im 21/99  soft-tissue]
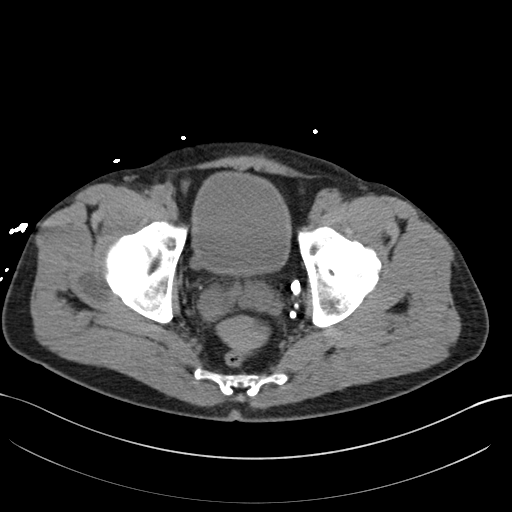
[im 31/99  soft-tissue]
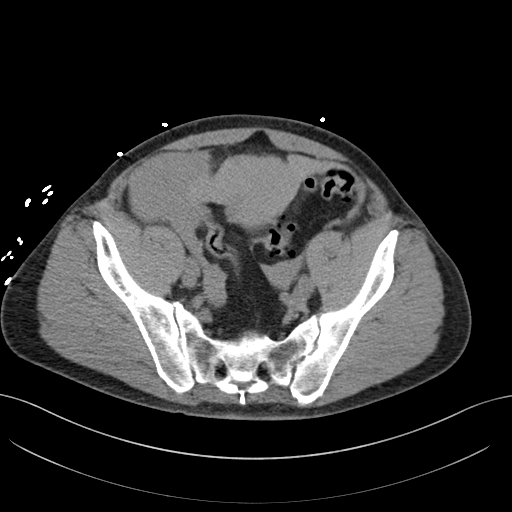
[im 37/99  soft-tissue]
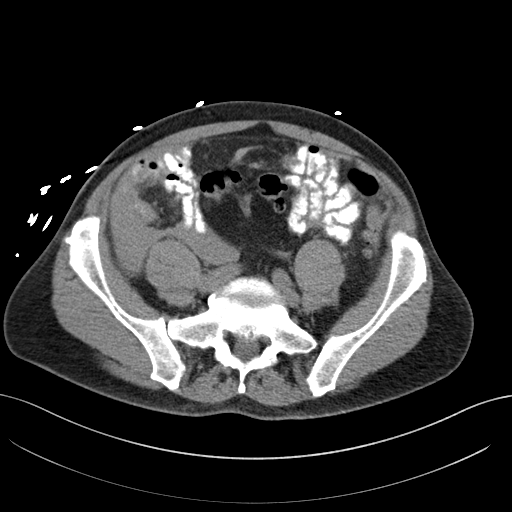
[im 47/99  soft-tissue]
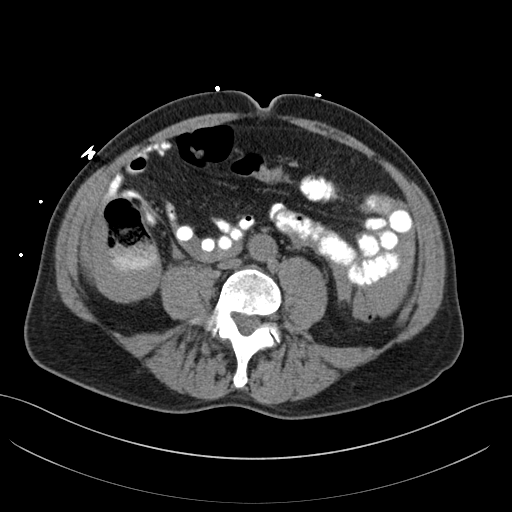
[im 52/99  soft-tissue]
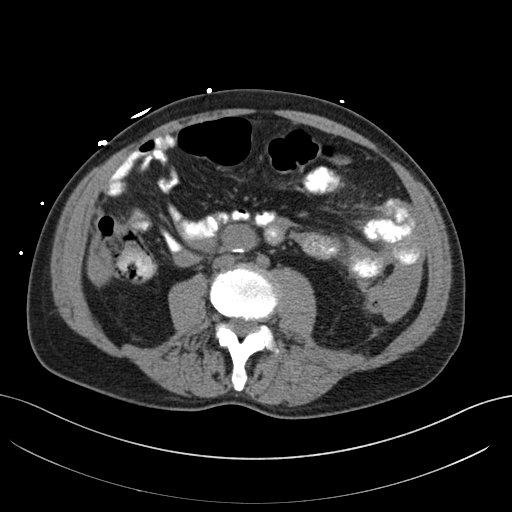
[im 62/99  soft-tissue]
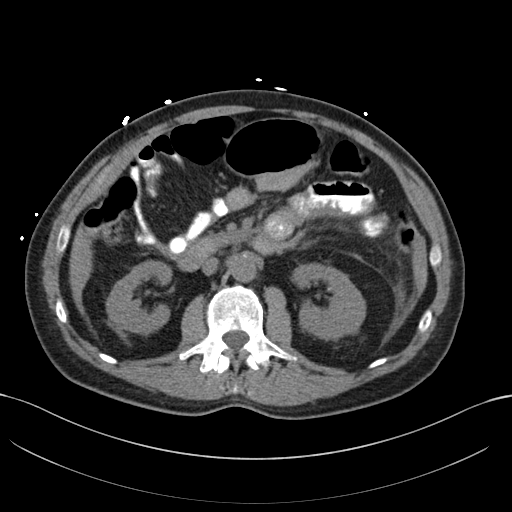
[im 68/99  soft-tissue]
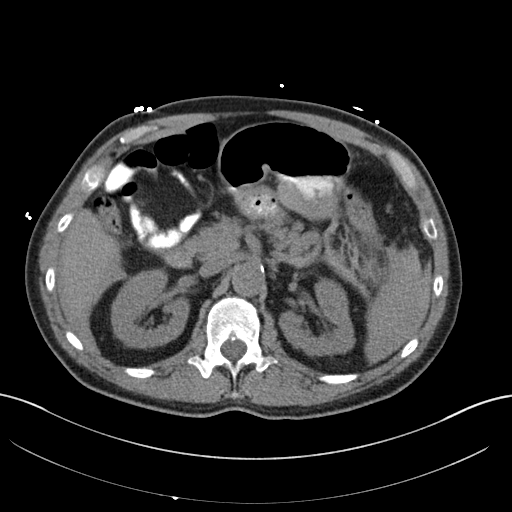
[im 68/99  bone]
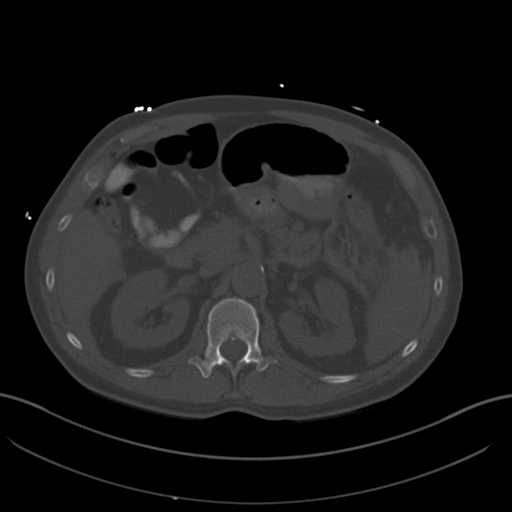
[im 78/99  soft-tissue]
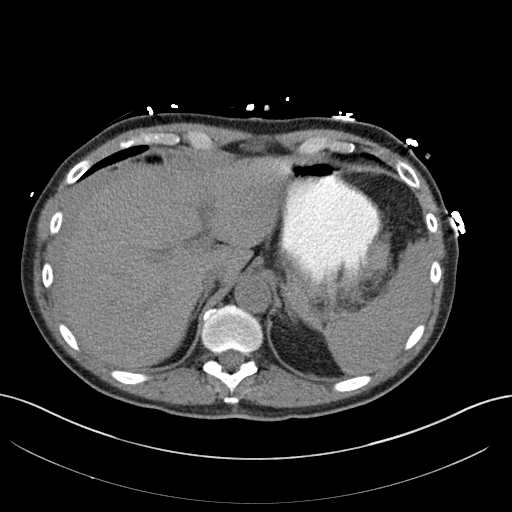
[im 83/99  soft-tissue]
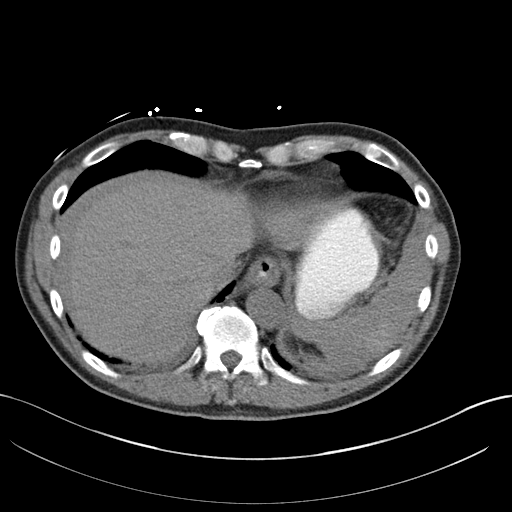
[im 93/99  soft-tissue]
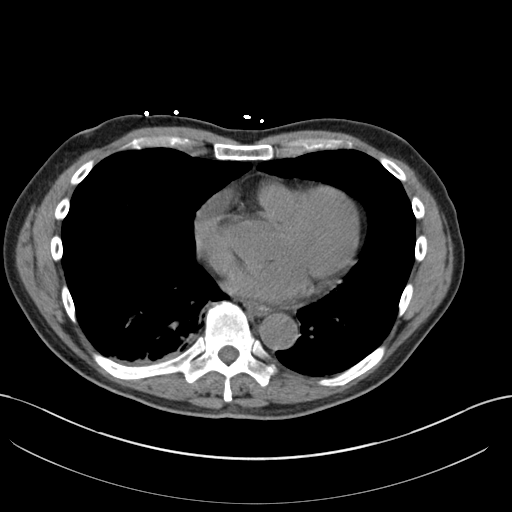

[Series 5: coronal st · coronal · 0.71mm/px · 3 of 137 slices shown]
[im 46/137  soft-tissue]
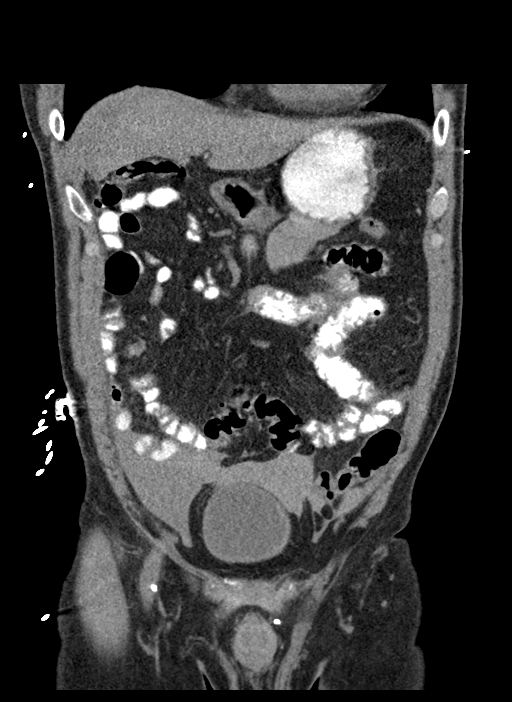
[im 61/137  soft-tissue]
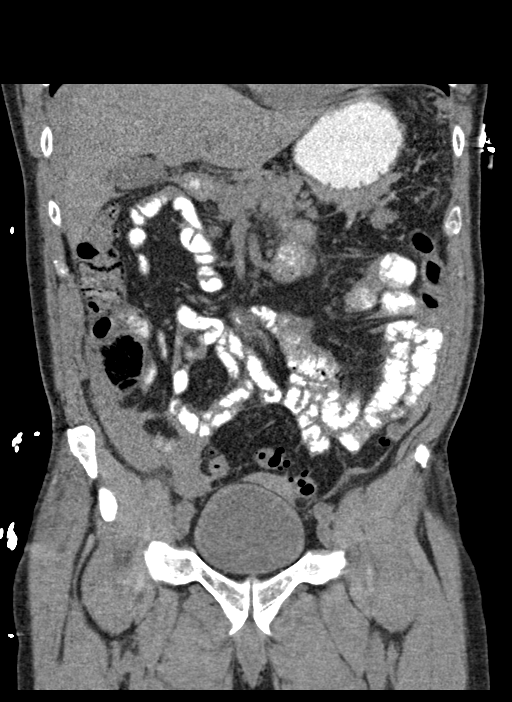
[im 76/137  soft-tissue]
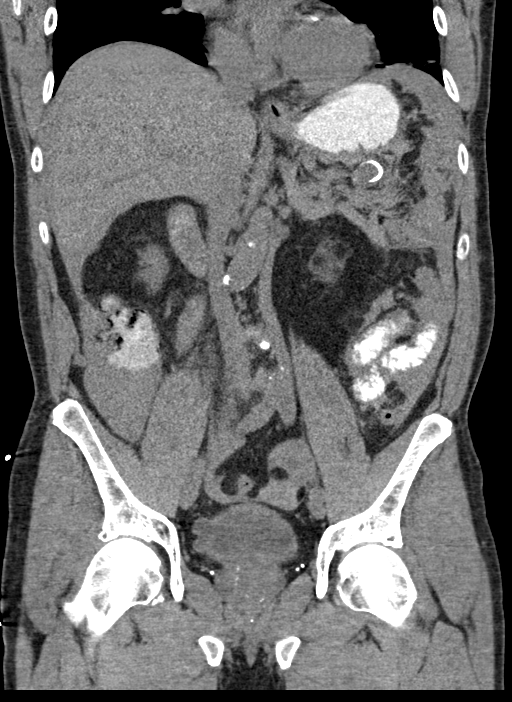

[15 of 46 positions shown; findings below may reference images not displayed]

FINDINGS: Lower chest: Streaky bibasilar atelectasis noted. No pleural
effusions or pulmonary infiltrates. Aortic and coronary artery
calcifications are noted.

Hepatobiliary: No hepatic lesions are identified without contrast.
Stable high attenuation perihepatic fluid. The gallbladder is
normal. No common bile duct dilatation.

Pancreas: No mass, inflammation or ductal dilatation.

Spleen: Normal size.  Stable high attenuation perihepatic fluid.

Adrenals/Urinary Tract: The adrenal glands are normal and stable.

Small renal calculi but no obstructing ureteral calculi or bladder
calculi. No worrisome renal or bladder lesions. Small bladder
diverticulum noted.

Stomach/Bowel: The stomach, duodenum, small bowel and colon are
unremarkable. No acute inflammatory changes, mass lesions or
obstructive findings. No leaking oral contrast is identified.
Significant sigmoid colon diverticulosis but no findings for acute
diverticulitis.

Vascular/Lymphatic: The aorta is normal in caliber. Mild tortuosity
and scattered atherosclerotic calcifications. No periaortic fluid
collections. No mesenteric or retroperitoneal mass or adenopathy.

Reproductive: The prostate gland and seminal vesicles are
unremarkable.

Other: Persistent but stable high attenuation fluid throughout the
abdomen pelvis worrisome for hemoperitoneum. The epicenter appears
to be in the left upper quadrant surrounding a 2 cm splenic artery
aneurysm. Could not exclude the possibility that this is leaking.
Recommend vascular surgery consultation.

Musculoskeletal: No significant bony findings.
IMPRESSION: 1. Persistent but stable high attenuation fluid throughout the
abdomen pelvis worrisome for hemoperitoneum. The epicenter appears
to be in the left upper quadrant surrounding a 2 cm splenic artery
aneurysm. Could not exclude the possibility that this is leaking.
Recommend vascular surgery consultation.
2. Sigmoid colon diverticulosis without findings for acute
diverticulitis.
3. Small renal calculi but no obstructing ureteral calculi or
bladder calculi.

## 2022-05-25 IMAGING — US US RENAL
1 series · 14 of 25 positions shown · non-contrast
Comparison: CT Abdomen and Pelvis 02/07/2020.

CLINICAL DATA: 69-year-old male with acute renal insufficiency.
Suspected hemoperitoneum on recent CT Abdomen and Pelvis.

EXAM:
RENAL / URINARY TRACT ULTRASOUND COMPLETE

[Series 1: us renal · 14 of 28 slices shown]
[im 1/28]
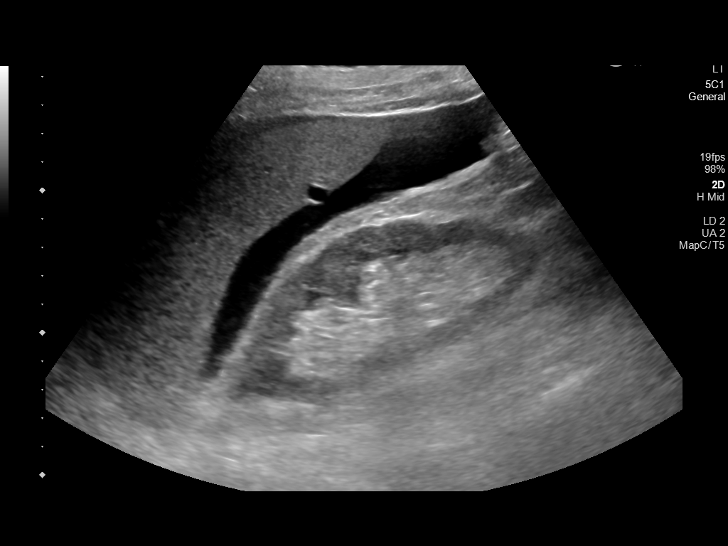
[im 3/28]
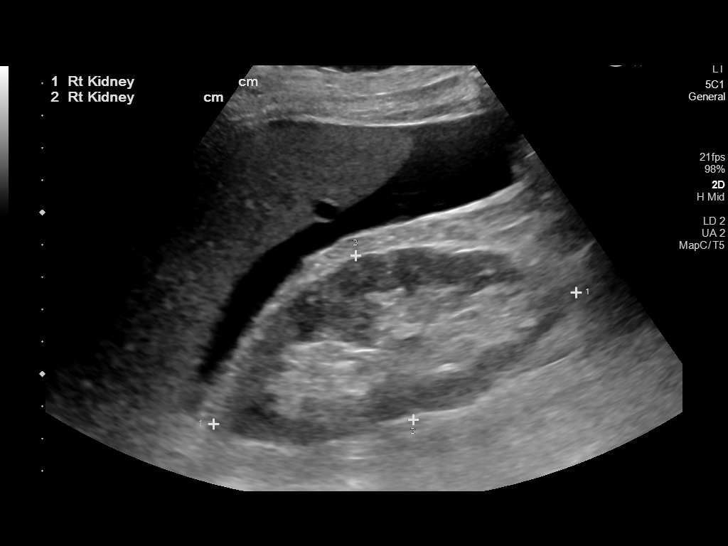
[im 5/28]
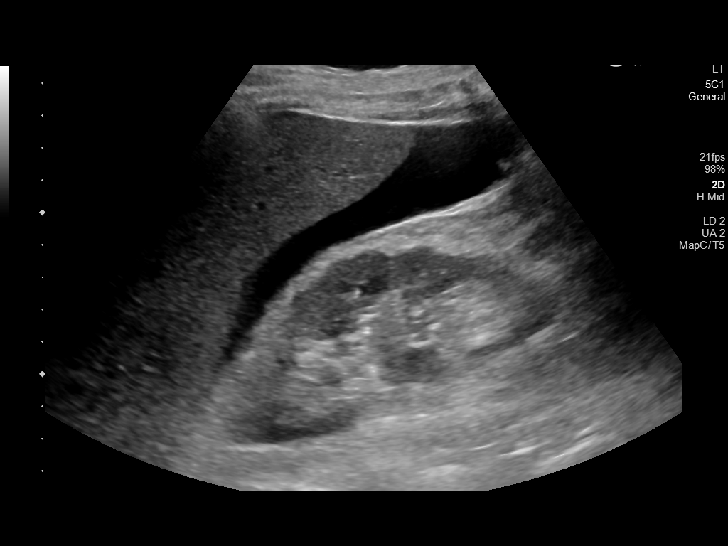
[im 7/28]
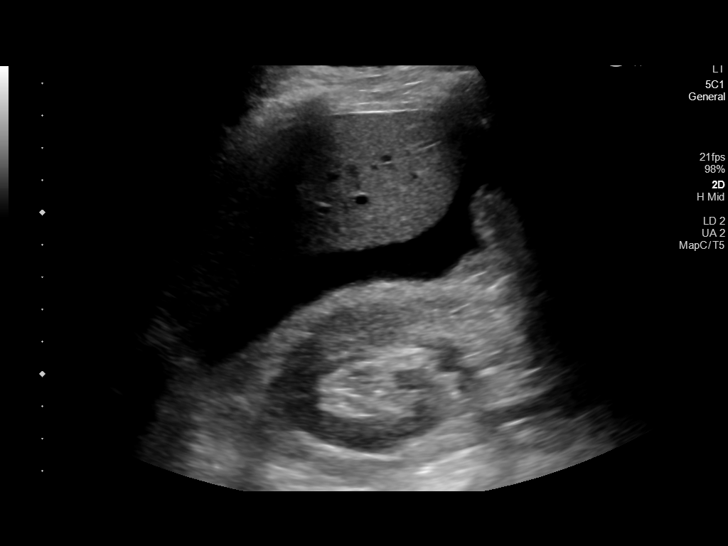
[im 10/28]
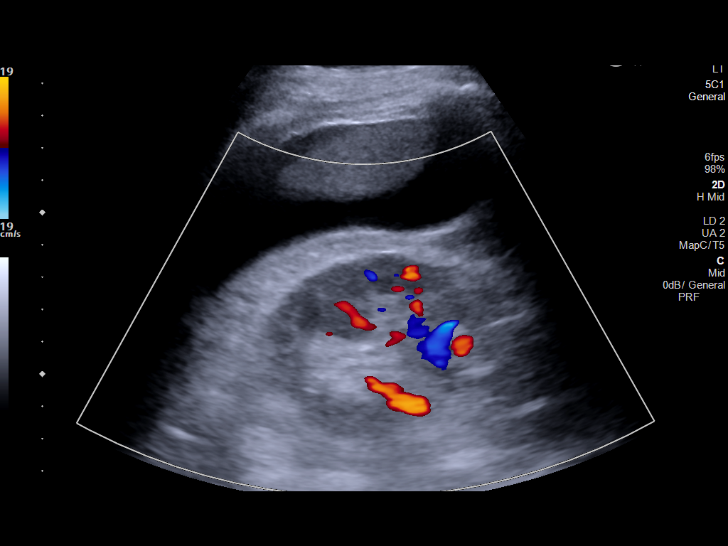
[im 11/28]
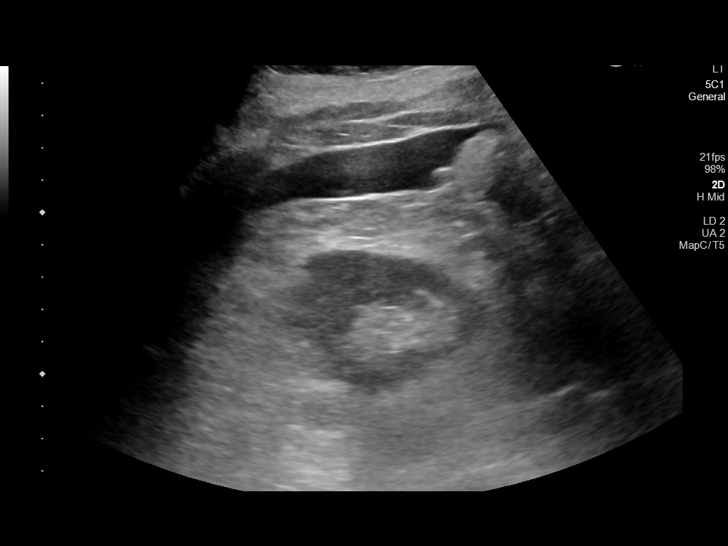
[im 13/28]
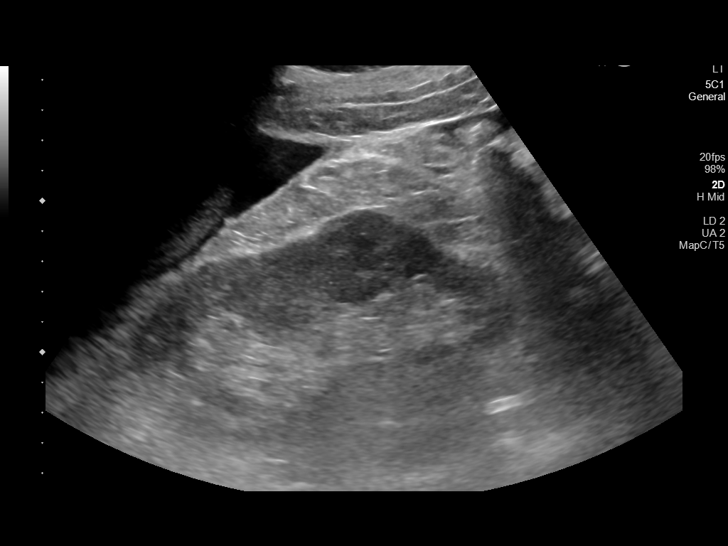
[im 15/28]
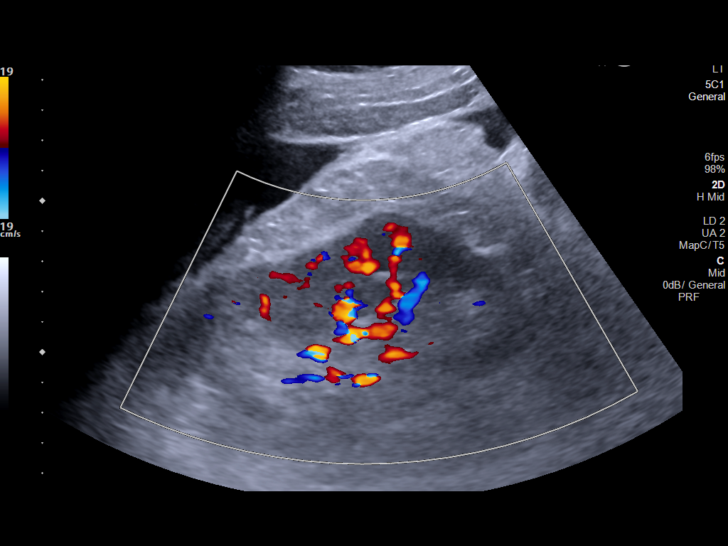
[im 17/28]
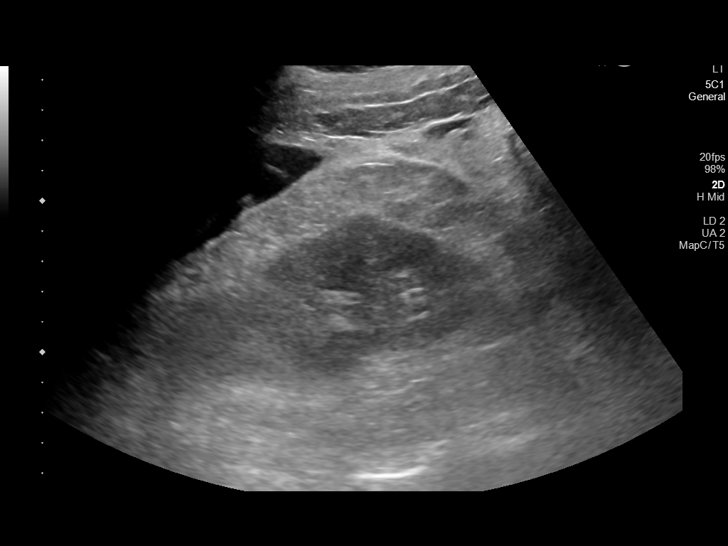
[im 19/28]
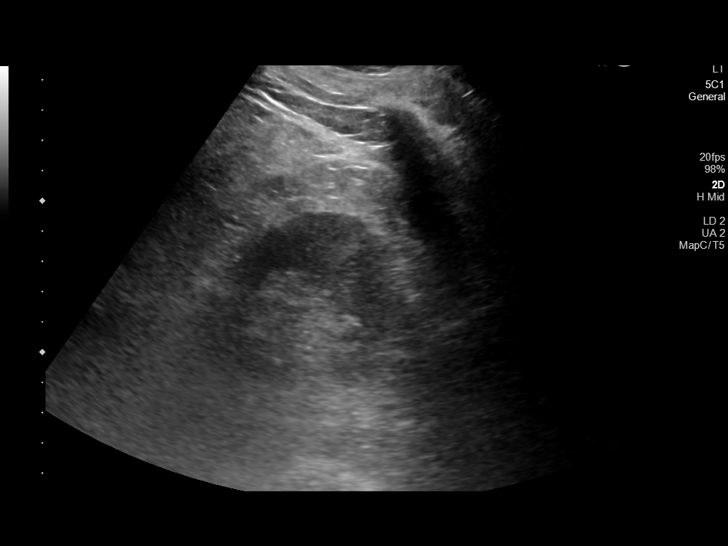
[im 21/28]
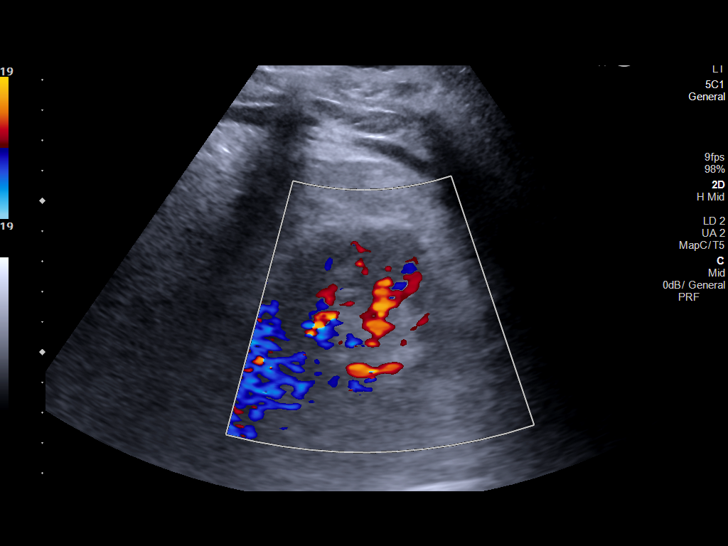
[im 23/28]
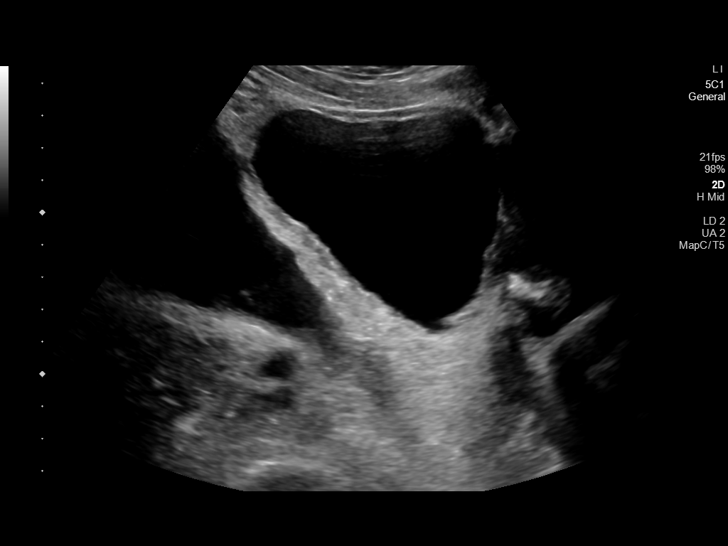
[im 25/28]
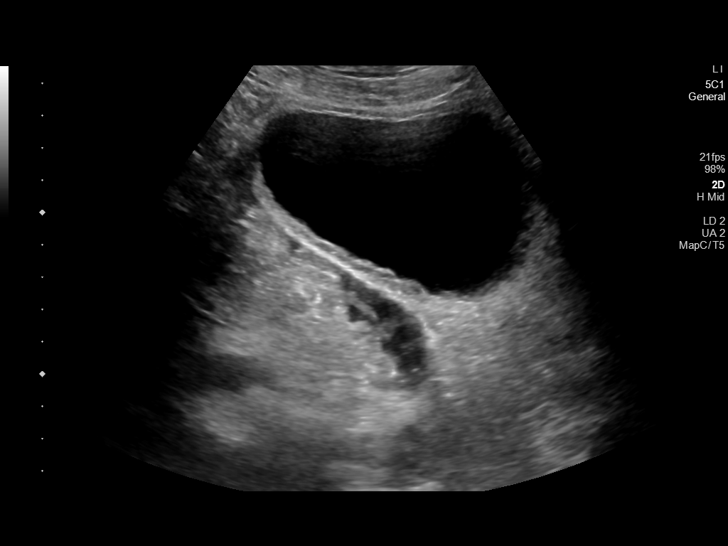
[im 28/28]
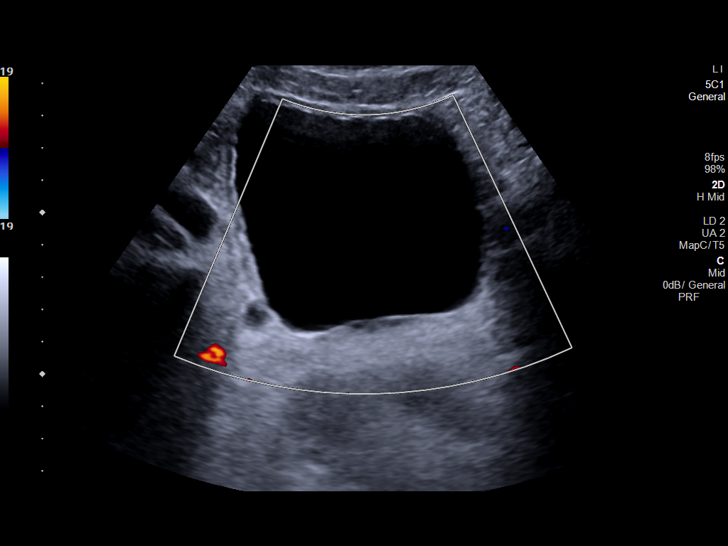

[14 of 25 positions shown; findings below may reference images not displayed]

FINDINGS: Right Kidney:

Renal measurements: 11.9 x 5.4 x 5.9 cm = volume: 198 mL.
Echogenicity at the upper limits of normal (image 4). No mass or
hydronephrosis visualized.

Left Kidney:

Renal measurements: 13.2 x 6.3 x 6.3 cm = volume: 273 mL.
Echogenicity at the upper limits of normal (image 15). No mass or
hydronephrosis visualized.

Bladder:

Appears normal for degree of bladder distention.

Other:

Free fluid in the both upper quadrants although visible fluid
appears fairly simple (images 4 and 13).
IMPRESSION: 1. No acute renal finding. Borderline renal cortical echogenicity
raising the possibility of some chronic medical renal disease.
2. Small volume of free fluid redemonstrated in both upper
quadrants.

## 2023-02-08 ENCOUNTER — Other Ambulatory Visit: Payer: Self-pay | Admitting: Pulmonary Disease

## 2023-03-08 ENCOUNTER — Other Ambulatory Visit: Payer: Self-pay | Admitting: Pulmonary Disease

## 2023-03-08 IMAGING — DX DG CHEST 2V
2 series · 2 of 2 positions shown · non-contrast
Comparison: None.

CLINICAL DATA: Cough

EXAM:
CHEST - 2 VIEW

[chest pa]
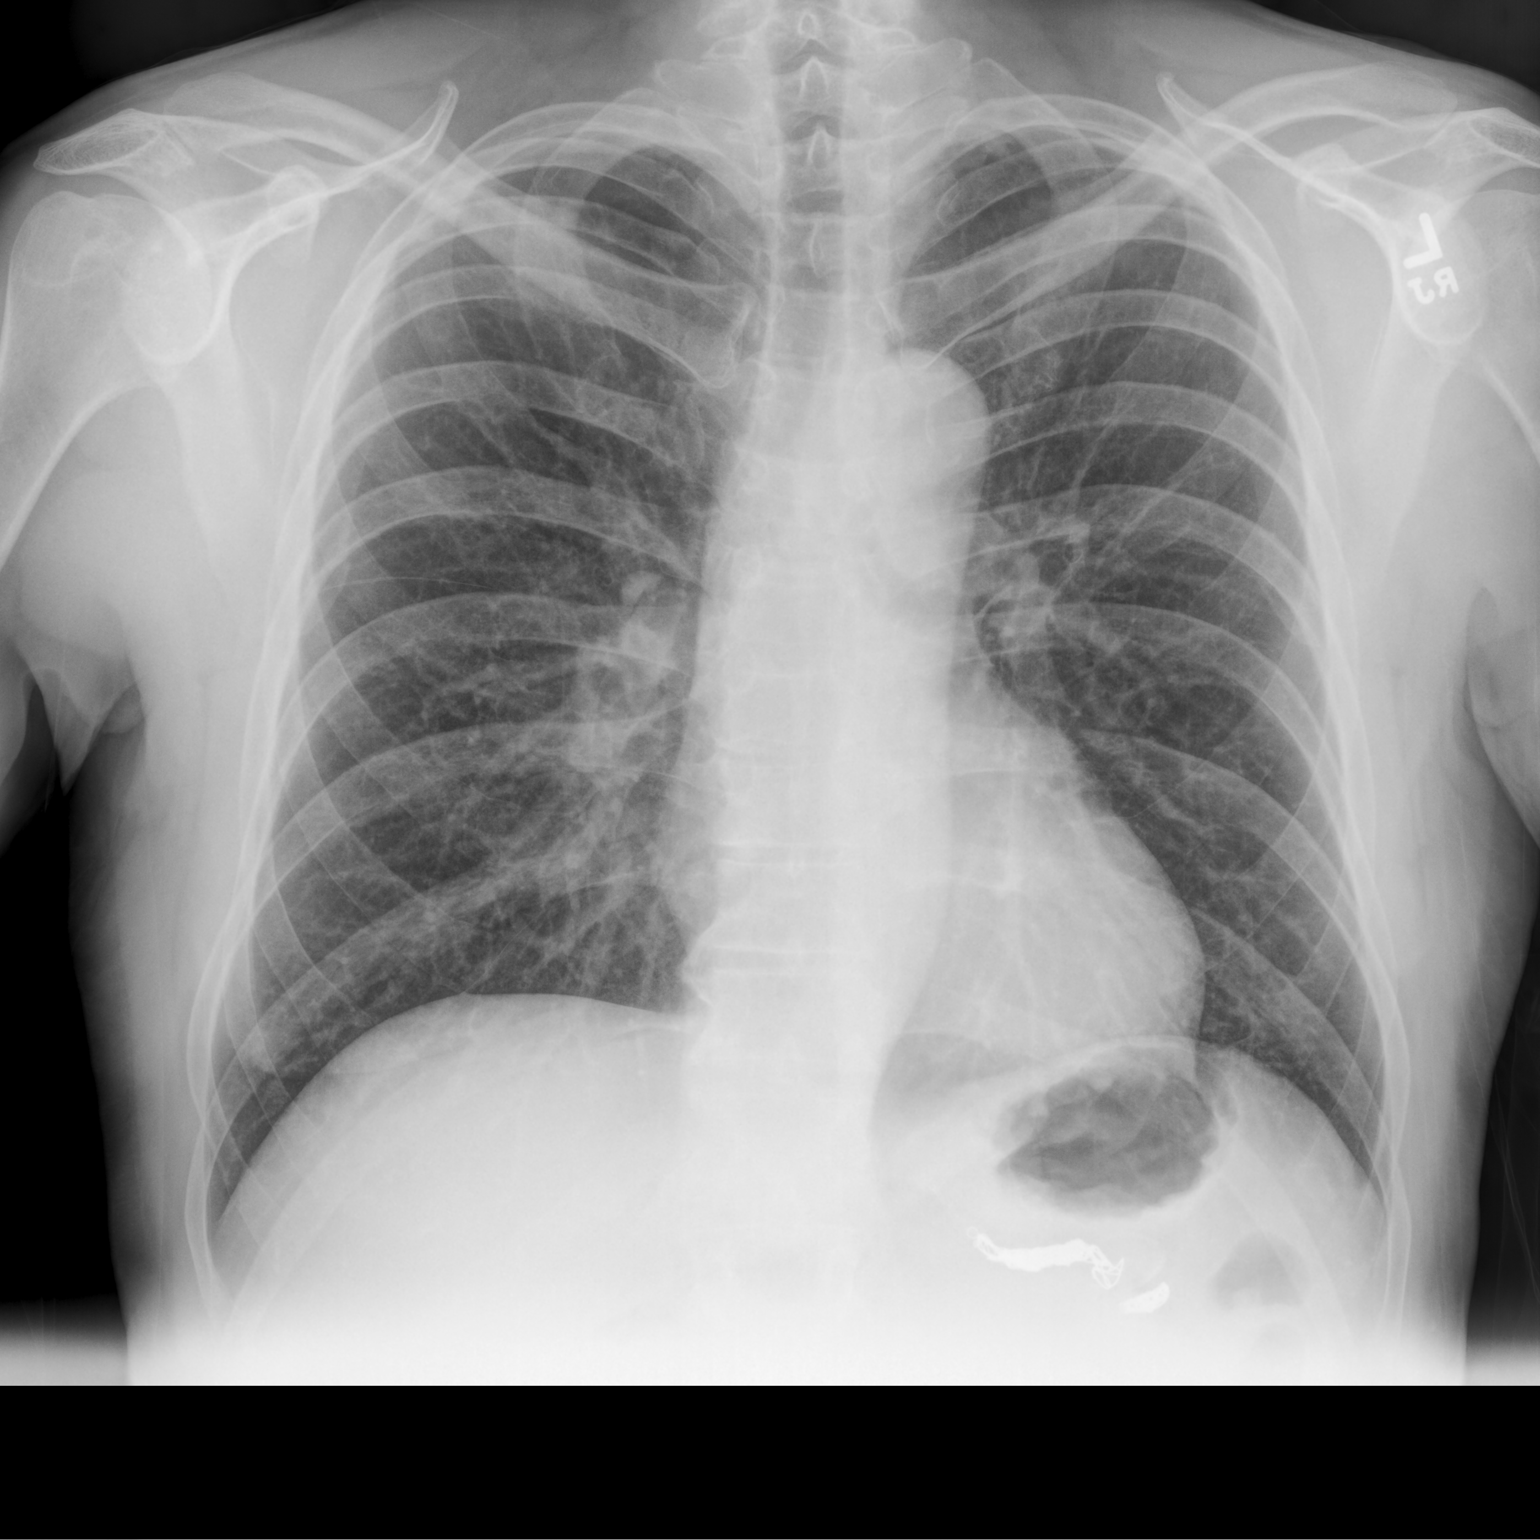

[chest lat]
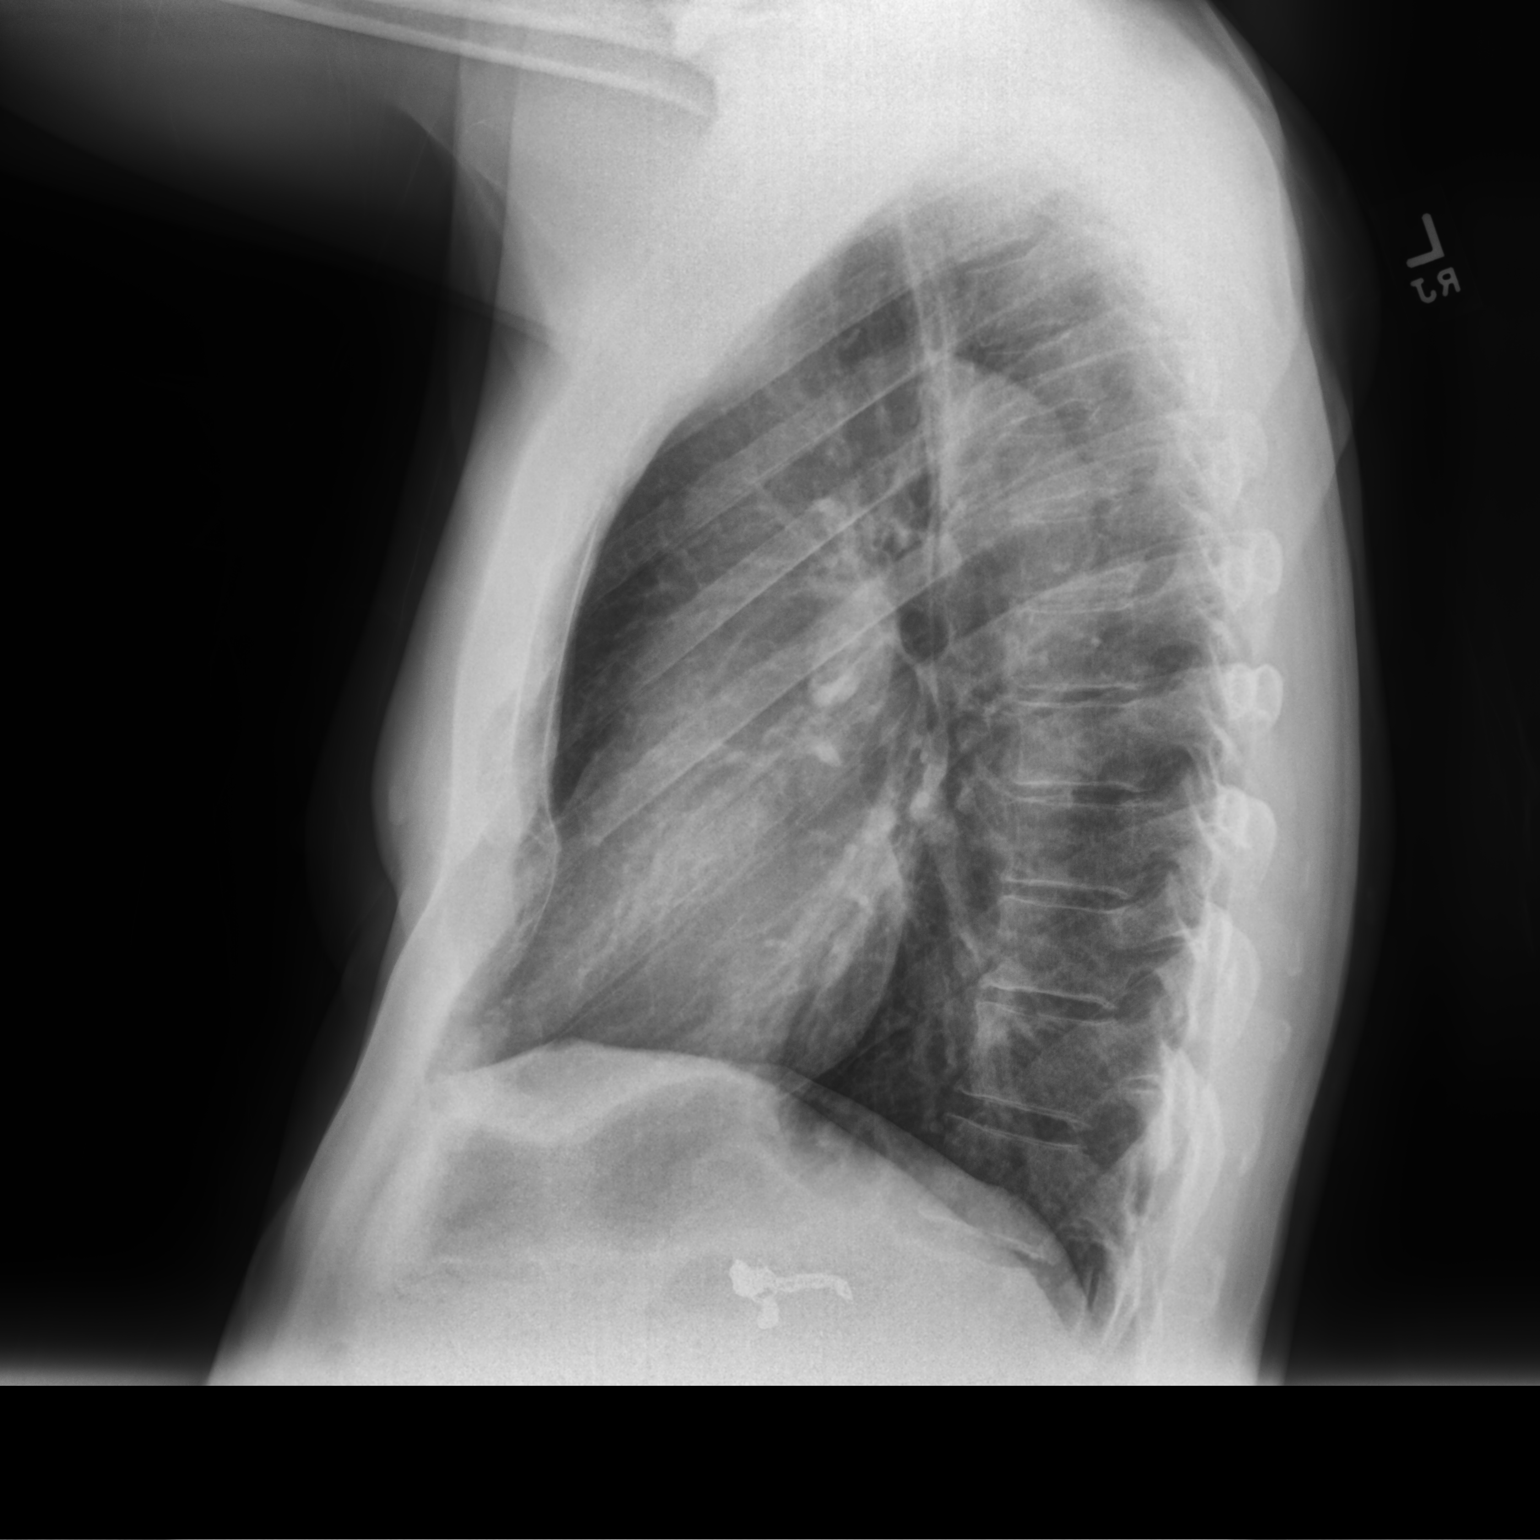

[2 of 2 positions shown; findings below may reference images not displayed]

FINDINGS: Mild pectus excavatum deformity. Left upper quadrant aneurysm coils.
Midline trachea. Normal heart size and mediastinal contours. No
pleural effusion or pneumothorax. Clear lungs.
IMPRESSION: No active cardiopulmonary disease.
